# Patient Record
Sex: Female | Born: 1990
Health system: Southern US, Community
[De-identification: ages and names within clinical notes are randomized; demographics above are authoritative.]

## PROBLEM LIST (undated history)

## (undated) DIAGNOSIS — E282 Polycystic ovarian syndrome: Secondary | ICD-10-CM

## (undated) HISTORY — PX: BREAST BIOPSY: SHX20

---
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
## 2013-03-01 DIAGNOSIS — E282 Polycystic ovarian syndrome: Secondary | ICD-10-CM | POA: Insufficient documentation

## 2019-02-15 DIAGNOSIS — F411 Generalized anxiety disorder: Secondary | ICD-10-CM | POA: Diagnosis not present

## 2019-02-25 DIAGNOSIS — F411 Generalized anxiety disorder: Secondary | ICD-10-CM | POA: Diagnosis not present

## 2019-03-04 DIAGNOSIS — F411 Generalized anxiety disorder: Secondary | ICD-10-CM | POA: Diagnosis not present

## 2019-03-15 DIAGNOSIS — F411 Generalized anxiety disorder: Secondary | ICD-10-CM | POA: Diagnosis not present

## 2019-03-23 DIAGNOSIS — F411 Generalized anxiety disorder: Secondary | ICD-10-CM | POA: Diagnosis not present

## 2019-03-30 DIAGNOSIS — F411 Generalized anxiety disorder: Secondary | ICD-10-CM | POA: Diagnosis not present

## 2019-04-06 DIAGNOSIS — F411 Generalized anxiety disorder: Secondary | ICD-10-CM | POA: Diagnosis not present

## 2019-04-16 DIAGNOSIS — F411 Generalized anxiety disorder: Secondary | ICD-10-CM | POA: Diagnosis not present

## 2019-04-21 DIAGNOSIS — F411 Generalized anxiety disorder: Secondary | ICD-10-CM | POA: Diagnosis not present

## 2019-04-28 DIAGNOSIS — F411 Generalized anxiety disorder: Secondary | ICD-10-CM | POA: Diagnosis not present

## 2019-05-06 DIAGNOSIS — F411 Generalized anxiety disorder: Secondary | ICD-10-CM | POA: Diagnosis not present

## 2019-05-11 DIAGNOSIS — F411 Generalized anxiety disorder: Secondary | ICD-10-CM | POA: Diagnosis not present

## 2019-05-18 DIAGNOSIS — F411 Generalized anxiety disorder: Secondary | ICD-10-CM | POA: Diagnosis not present

## 2019-05-25 DIAGNOSIS — F411 Generalized anxiety disorder: Secondary | ICD-10-CM | POA: Diagnosis not present

## 2019-06-01 DIAGNOSIS — F411 Generalized anxiety disorder: Secondary | ICD-10-CM | POA: Diagnosis not present

## 2019-06-08 DIAGNOSIS — F411 Generalized anxiety disorder: Secondary | ICD-10-CM | POA: Diagnosis not present

## 2019-06-15 DIAGNOSIS — F411 Generalized anxiety disorder: Secondary | ICD-10-CM | POA: Diagnosis not present

## 2019-06-22 DIAGNOSIS — F411 Generalized anxiety disorder: Secondary | ICD-10-CM | POA: Diagnosis not present

## 2019-06-29 DIAGNOSIS — F411 Generalized anxiety disorder: Secondary | ICD-10-CM | POA: Diagnosis not present

## 2019-07-06 DIAGNOSIS — F411 Generalized anxiety disorder: Secondary | ICD-10-CM | POA: Diagnosis not present

## 2019-07-13 DIAGNOSIS — F411 Generalized anxiety disorder: Secondary | ICD-10-CM | POA: Diagnosis not present

## 2019-07-20 DIAGNOSIS — F411 Generalized anxiety disorder: Secondary | ICD-10-CM | POA: Diagnosis not present

## 2019-07-27 DIAGNOSIS — F411 Generalized anxiety disorder: Secondary | ICD-10-CM | POA: Diagnosis not present

## 2019-08-03 DIAGNOSIS — F411 Generalized anxiety disorder: Secondary | ICD-10-CM | POA: Diagnosis not present

## 2019-08-17 DIAGNOSIS — F411 Generalized anxiety disorder: Secondary | ICD-10-CM | POA: Diagnosis not present

## 2019-08-24 DIAGNOSIS — F411 Generalized anxiety disorder: Secondary | ICD-10-CM | POA: Diagnosis not present

## 2019-08-31 DIAGNOSIS — F411 Generalized anxiety disorder: Secondary | ICD-10-CM | POA: Diagnosis not present

## 2019-09-07 DIAGNOSIS — F411 Generalized anxiety disorder: Secondary | ICD-10-CM | POA: Diagnosis not present

## 2019-09-13 DIAGNOSIS — G44209 Tension-type headache, unspecified, not intractable: Secondary | ICD-10-CM | POA: Diagnosis not present

## 2019-09-13 DIAGNOSIS — M9902 Segmental and somatic dysfunction of thoracic region: Secondary | ICD-10-CM | POA: Diagnosis not present

## 2019-09-13 DIAGNOSIS — M9901 Segmental and somatic dysfunction of cervical region: Secondary | ICD-10-CM | POA: Diagnosis not present

## 2019-09-13 DIAGNOSIS — M9903 Segmental and somatic dysfunction of lumbar region: Secondary | ICD-10-CM | POA: Diagnosis not present

## 2019-09-14 DIAGNOSIS — F411 Generalized anxiety disorder: Secondary | ICD-10-CM | POA: Diagnosis not present

## 2019-09-14 DIAGNOSIS — G44209 Tension-type headache, unspecified, not intractable: Secondary | ICD-10-CM | POA: Diagnosis not present

## 2019-09-14 DIAGNOSIS — M9903 Segmental and somatic dysfunction of lumbar region: Secondary | ICD-10-CM | POA: Diagnosis not present

## 2019-09-14 DIAGNOSIS — M9901 Segmental and somatic dysfunction of cervical region: Secondary | ICD-10-CM | POA: Diagnosis not present

## 2019-09-14 DIAGNOSIS — M9902 Segmental and somatic dysfunction of thoracic region: Secondary | ICD-10-CM | POA: Diagnosis not present

## 2019-09-17 DIAGNOSIS — M9902 Segmental and somatic dysfunction of thoracic region: Secondary | ICD-10-CM | POA: Diagnosis not present

## 2019-09-17 DIAGNOSIS — M9901 Segmental and somatic dysfunction of cervical region: Secondary | ICD-10-CM | POA: Diagnosis not present

## 2019-09-17 DIAGNOSIS — M9903 Segmental and somatic dysfunction of lumbar region: Secondary | ICD-10-CM | POA: Diagnosis not present

## 2019-09-21 DIAGNOSIS — M9903 Segmental and somatic dysfunction of lumbar region: Secondary | ICD-10-CM | POA: Diagnosis not present

## 2019-09-21 DIAGNOSIS — M9901 Segmental and somatic dysfunction of cervical region: Secondary | ICD-10-CM | POA: Diagnosis not present

## 2019-09-21 DIAGNOSIS — M9902 Segmental and somatic dysfunction of thoracic region: Secondary | ICD-10-CM | POA: Diagnosis not present

## 2019-09-21 DIAGNOSIS — F411 Generalized anxiety disorder: Secondary | ICD-10-CM | POA: Diagnosis not present

## 2019-09-24 DIAGNOSIS — M9902 Segmental and somatic dysfunction of thoracic region: Secondary | ICD-10-CM | POA: Diagnosis not present

## 2019-09-24 DIAGNOSIS — M9901 Segmental and somatic dysfunction of cervical region: Secondary | ICD-10-CM | POA: Diagnosis not present

## 2019-09-24 DIAGNOSIS — M9903 Segmental and somatic dysfunction of lumbar region: Secondary | ICD-10-CM | POA: Diagnosis not present

## 2019-09-28 DIAGNOSIS — F411 Generalized anxiety disorder: Secondary | ICD-10-CM | POA: Diagnosis not present

## 2019-10-04 DIAGNOSIS — M9906 Segmental and somatic dysfunction of lower extremity: Secondary | ICD-10-CM | POA: Diagnosis not present

## 2019-10-04 DIAGNOSIS — M9902 Segmental and somatic dysfunction of thoracic region: Secondary | ICD-10-CM | POA: Diagnosis not present

## 2019-10-04 DIAGNOSIS — M9901 Segmental and somatic dysfunction of cervical region: Secondary | ICD-10-CM | POA: Diagnosis not present

## 2019-10-04 DIAGNOSIS — M9903 Segmental and somatic dysfunction of lumbar region: Secondary | ICD-10-CM | POA: Diagnosis not present

## 2019-10-12 DIAGNOSIS — F411 Generalized anxiety disorder: Secondary | ICD-10-CM | POA: Diagnosis not present

## 2019-10-12 DIAGNOSIS — M9901 Segmental and somatic dysfunction of cervical region: Secondary | ICD-10-CM | POA: Diagnosis not present

## 2019-10-12 DIAGNOSIS — M9903 Segmental and somatic dysfunction of lumbar region: Secondary | ICD-10-CM | POA: Diagnosis not present

## 2019-10-12 DIAGNOSIS — M9906 Segmental and somatic dysfunction of lower extremity: Secondary | ICD-10-CM | POA: Diagnosis not present

## 2019-10-12 DIAGNOSIS — M9902 Segmental and somatic dysfunction of thoracic region: Secondary | ICD-10-CM | POA: Diagnosis not present

## 2019-10-19 DIAGNOSIS — M9902 Segmental and somatic dysfunction of thoracic region: Secondary | ICD-10-CM | POA: Diagnosis not present

## 2019-10-19 DIAGNOSIS — F411 Generalized anxiety disorder: Secondary | ICD-10-CM | POA: Diagnosis not present

## 2019-10-19 DIAGNOSIS — M9903 Segmental and somatic dysfunction of lumbar region: Secondary | ICD-10-CM | POA: Diagnosis not present

## 2019-10-19 DIAGNOSIS — M9906 Segmental and somatic dysfunction of lower extremity: Secondary | ICD-10-CM | POA: Diagnosis not present

## 2019-10-19 DIAGNOSIS — M9901 Segmental and somatic dysfunction of cervical region: Secondary | ICD-10-CM | POA: Diagnosis not present

## 2019-11-02 DIAGNOSIS — F411 Generalized anxiety disorder: Secondary | ICD-10-CM | POA: Diagnosis not present

## 2019-11-04 DIAGNOSIS — M9903 Segmental and somatic dysfunction of lumbar region: Secondary | ICD-10-CM | POA: Diagnosis not present

## 2019-11-04 DIAGNOSIS — M9902 Segmental and somatic dysfunction of thoracic region: Secondary | ICD-10-CM | POA: Diagnosis not present

## 2019-11-04 DIAGNOSIS — M9901 Segmental and somatic dysfunction of cervical region: Secondary | ICD-10-CM | POA: Diagnosis not present

## 2019-11-09 DIAGNOSIS — F411 Generalized anxiety disorder: Secondary | ICD-10-CM | POA: Diagnosis not present

## 2019-11-16 DIAGNOSIS — F411 Generalized anxiety disorder: Secondary | ICD-10-CM | POA: Diagnosis not present

## 2019-11-18 DIAGNOSIS — M9901 Segmental and somatic dysfunction of cervical region: Secondary | ICD-10-CM | POA: Diagnosis not present

## 2019-11-18 DIAGNOSIS — M9903 Segmental and somatic dysfunction of lumbar region: Secondary | ICD-10-CM | POA: Diagnosis not present

## 2019-11-18 DIAGNOSIS — M9902 Segmental and somatic dysfunction of thoracic region: Secondary | ICD-10-CM | POA: Diagnosis not present

## 2019-11-30 DIAGNOSIS — M9901 Segmental and somatic dysfunction of cervical region: Secondary | ICD-10-CM | POA: Diagnosis not present

## 2019-11-30 DIAGNOSIS — M9902 Segmental and somatic dysfunction of thoracic region: Secondary | ICD-10-CM | POA: Diagnosis not present

## 2019-11-30 DIAGNOSIS — F411 Generalized anxiety disorder: Secondary | ICD-10-CM | POA: Diagnosis not present

## 2019-11-30 DIAGNOSIS — M9903 Segmental and somatic dysfunction of lumbar region: Secondary | ICD-10-CM | POA: Diagnosis not present

## 2019-12-07 DIAGNOSIS — F411 Generalized anxiety disorder: Secondary | ICD-10-CM | POA: Diagnosis not present

## 2019-12-13 DIAGNOSIS — M9902 Segmental and somatic dysfunction of thoracic region: Secondary | ICD-10-CM | POA: Diagnosis not present

## 2019-12-13 DIAGNOSIS — M9901 Segmental and somatic dysfunction of cervical region: Secondary | ICD-10-CM | POA: Diagnosis not present

## 2019-12-13 DIAGNOSIS — M9903 Segmental and somatic dysfunction of lumbar region: Secondary | ICD-10-CM | POA: Diagnosis not present

## 2019-12-14 DIAGNOSIS — F411 Generalized anxiety disorder: Secondary | ICD-10-CM | POA: Diagnosis not present

## 2019-12-21 DIAGNOSIS — F411 Generalized anxiety disorder: Secondary | ICD-10-CM | POA: Diagnosis not present

## 2019-12-24 DIAGNOSIS — M9906 Segmental and somatic dysfunction of lower extremity: Secondary | ICD-10-CM | POA: Diagnosis not present

## 2019-12-24 DIAGNOSIS — M9901 Segmental and somatic dysfunction of cervical region: Secondary | ICD-10-CM | POA: Diagnosis not present

## 2019-12-24 DIAGNOSIS — M9903 Segmental and somatic dysfunction of lumbar region: Secondary | ICD-10-CM | POA: Diagnosis not present

## 2019-12-24 DIAGNOSIS — M9902 Segmental and somatic dysfunction of thoracic region: Secondary | ICD-10-CM | POA: Diagnosis not present

## 2020-01-04 DIAGNOSIS — F411 Generalized anxiety disorder: Secondary | ICD-10-CM | POA: Diagnosis not present

## 2020-01-07 DIAGNOSIS — M9902 Segmental and somatic dysfunction of thoracic region: Secondary | ICD-10-CM | POA: Diagnosis not present

## 2020-01-07 DIAGNOSIS — M9901 Segmental and somatic dysfunction of cervical region: Secondary | ICD-10-CM | POA: Diagnosis not present

## 2020-01-07 DIAGNOSIS — M9903 Segmental and somatic dysfunction of lumbar region: Secondary | ICD-10-CM | POA: Diagnosis not present

## 2020-01-07 DIAGNOSIS — M9906 Segmental and somatic dysfunction of lower extremity: Secondary | ICD-10-CM | POA: Diagnosis not present

## 2020-01-14 DIAGNOSIS — M9906 Segmental and somatic dysfunction of lower extremity: Secondary | ICD-10-CM | POA: Diagnosis not present

## 2020-01-14 DIAGNOSIS — M9903 Segmental and somatic dysfunction of lumbar region: Secondary | ICD-10-CM | POA: Diagnosis not present

## 2020-01-14 DIAGNOSIS — M9902 Segmental and somatic dysfunction of thoracic region: Secondary | ICD-10-CM | POA: Diagnosis not present

## 2020-01-14 DIAGNOSIS — M9901 Segmental and somatic dysfunction of cervical region: Secondary | ICD-10-CM | POA: Diagnosis not present

## 2020-01-21 DIAGNOSIS — M9903 Segmental and somatic dysfunction of lumbar region: Secondary | ICD-10-CM | POA: Diagnosis not present

## 2020-01-21 DIAGNOSIS — M9902 Segmental and somatic dysfunction of thoracic region: Secondary | ICD-10-CM | POA: Diagnosis not present

## 2020-01-21 DIAGNOSIS — M9901 Segmental and somatic dysfunction of cervical region: Secondary | ICD-10-CM | POA: Diagnosis not present

## 2020-01-25 DIAGNOSIS — F411 Generalized anxiety disorder: Secondary | ICD-10-CM | POA: Diagnosis not present

## 2020-02-01 DIAGNOSIS — F411 Generalized anxiety disorder: Secondary | ICD-10-CM | POA: Diagnosis not present

## 2020-02-04 DIAGNOSIS — M9902 Segmental and somatic dysfunction of thoracic region: Secondary | ICD-10-CM | POA: Diagnosis not present

## 2020-02-04 DIAGNOSIS — M9906 Segmental and somatic dysfunction of lower extremity: Secondary | ICD-10-CM | POA: Diagnosis not present

## 2020-02-04 DIAGNOSIS — M9901 Segmental and somatic dysfunction of cervical region: Secondary | ICD-10-CM | POA: Diagnosis not present

## 2020-02-04 DIAGNOSIS — M9903 Segmental and somatic dysfunction of lumbar region: Secondary | ICD-10-CM | POA: Diagnosis not present

## 2020-02-08 DIAGNOSIS — F411 Generalized anxiety disorder: Secondary | ICD-10-CM | POA: Diagnosis not present

## 2020-02-15 DIAGNOSIS — F411 Generalized anxiety disorder: Secondary | ICD-10-CM | POA: Diagnosis not present

## 2020-02-18 DIAGNOSIS — M9902 Segmental and somatic dysfunction of thoracic region: Secondary | ICD-10-CM | POA: Diagnosis not present

## 2020-02-18 DIAGNOSIS — M9903 Segmental and somatic dysfunction of lumbar region: Secondary | ICD-10-CM | POA: Diagnosis not present

## 2020-02-18 DIAGNOSIS — M9906 Segmental and somatic dysfunction of lower extremity: Secondary | ICD-10-CM | POA: Diagnosis not present

## 2020-02-18 DIAGNOSIS — M9901 Segmental and somatic dysfunction of cervical region: Secondary | ICD-10-CM | POA: Diagnosis not present

## 2020-02-22 DIAGNOSIS — F411 Generalized anxiety disorder: Secondary | ICD-10-CM | POA: Diagnosis not present

## 2020-02-29 DIAGNOSIS — F411 Generalized anxiety disorder: Secondary | ICD-10-CM | POA: Diagnosis not present

## 2020-03-06 DIAGNOSIS — M9902 Segmental and somatic dysfunction of thoracic region: Secondary | ICD-10-CM | POA: Diagnosis not present

## 2020-03-06 DIAGNOSIS — M9903 Segmental and somatic dysfunction of lumbar region: Secondary | ICD-10-CM | POA: Diagnosis not present

## 2020-03-06 DIAGNOSIS — M9901 Segmental and somatic dysfunction of cervical region: Secondary | ICD-10-CM | POA: Diagnosis not present

## 2020-03-07 DIAGNOSIS — F411 Generalized anxiety disorder: Secondary | ICD-10-CM | POA: Diagnosis not present

## 2020-03-14 DIAGNOSIS — M9902 Segmental and somatic dysfunction of thoracic region: Secondary | ICD-10-CM | POA: Diagnosis not present

## 2020-03-14 DIAGNOSIS — M9903 Segmental and somatic dysfunction of lumbar region: Secondary | ICD-10-CM | POA: Diagnosis not present

## 2020-03-14 DIAGNOSIS — M9901 Segmental and somatic dysfunction of cervical region: Secondary | ICD-10-CM | POA: Diagnosis not present

## 2020-03-15 DIAGNOSIS — Z20822 Contact with and (suspected) exposure to covid-19: Secondary | ICD-10-CM | POA: Diagnosis not present

## 2020-03-27 DIAGNOSIS — M9901 Segmental and somatic dysfunction of cervical region: Secondary | ICD-10-CM | POA: Diagnosis not present

## 2020-03-27 DIAGNOSIS — M9902 Segmental and somatic dysfunction of thoracic region: Secondary | ICD-10-CM | POA: Diagnosis not present

## 2020-03-27 DIAGNOSIS — M9903 Segmental and somatic dysfunction of lumbar region: Secondary | ICD-10-CM | POA: Diagnosis not present

## 2020-03-28 DIAGNOSIS — F411 Generalized anxiety disorder: Secondary | ICD-10-CM | POA: Diagnosis not present

## 2020-04-04 DIAGNOSIS — F411 Generalized anxiety disorder: Secondary | ICD-10-CM | POA: Diagnosis not present

## 2020-04-11 DIAGNOSIS — M9903 Segmental and somatic dysfunction of lumbar region: Secondary | ICD-10-CM | POA: Diagnosis not present

## 2020-04-11 DIAGNOSIS — M9901 Segmental and somatic dysfunction of cervical region: Secondary | ICD-10-CM | POA: Diagnosis not present

## 2020-04-11 DIAGNOSIS — M9902 Segmental and somatic dysfunction of thoracic region: Secondary | ICD-10-CM | POA: Diagnosis not present

## 2020-04-11 DIAGNOSIS — F411 Generalized anxiety disorder: Secondary | ICD-10-CM | POA: Diagnosis not present

## 2020-04-17 ENCOUNTER — Other Ambulatory Visit: Payer: Self-pay

## 2020-04-17 ENCOUNTER — Ambulatory Visit
Admission: EM | Admit: 2020-04-17 | Discharge: 2020-04-17 | Disposition: A | Discharge status: Home / Self Care | Payer: BC Managed Care – PPO | Attending: Family Medicine | Admitting: Family Medicine

## 2020-04-17 DIAGNOSIS — J019 Acute sinusitis, unspecified: Secondary | ICD-10-CM | POA: Diagnosis not present

## 2020-04-17 HISTORY — DX: Polycystic ovarian syndrome: E28.2

## 2020-04-17 MED ORDER — AMOXICILLIN-POT CLAVULANATE 875-125 MG PO TABS: 1.0000 | ORAL_TABLET | Freq: Two times a day (BID) | ORAL | 0 refills | Status: DC

## 2020-04-17 NOTE — ED Triage Notes (Signed)
Patient complains of sinus pain and pressure, headache, fatigue that started on Thursday. Patient states that she has been taking her allergy medication daily. Reports that she was outside this weekend and they had cut hay and had a bonfire and thinks this [place] have exacerbated her symptoms.

## 2020-04-17 NOTE — ED Provider Notes (Signed)
[facility]    CSN: [id number] Arrival date & time: 04/17/20  0945      History   Chief Complaint Chief Complaint  Patient presents with  . Sinusitis   HPI  29 year old female presents with the above complaint.  Patient reports that she is been sick since Thursday.  Reports congestion, sinus pain and pressure, fatigue.  No documented fever.  Also reports some sore throat as well as chest tightness.  She has been taking over-the-counter medication without relief.  She thinks that this [place] have been exacerbated by being exposed to hay.  No other associated symptoms.  No other complaints.  Past Medical History:  Diagnosis Date  . PCOS (polycystic ovarian syndrome)    Past Surgical History:  Procedure Laterality Date  . BREAST BIOPSY    . KNEE SURGERY Right 2010   OB History   No obstetric history on file.    Home Medications    Prior to Admission medications   Medication Sig Start Date End Date Taking? Authorizing Provider  loratadine (CLARITIN) 10 MG tablet Take 10 mg by mouth daily.   Yes [provider]  amoxicillin-clavulanate (AUGMENTIN) 875-125 MG tablet Take 1 tablet by mouth 2 (two) times daily. 04/17/20   Coral Spikes, DO   Family History Family History  Problem Relation Age of Onset  . Breast cancer Mother   . Melanoma Father    Social History Social History   Tobacco Use  . Smoking status: Former Research scientist (life sciences)  . Smokeless tobacco: Never Used  Substance Use Topics  . Alcohol use: Yes    Comment: occasionally  . Drug use: Never   Allergies   Patient has no known allergies.   Review of Systems Review of Systems  Constitutional: Positive for fatigue. Negative for fever.  HENT: Positive for congestion, sinus pressure and sinus pain.   Respiratory: Positive for chest tightness.   Neurological: Positive for headaches.   Physical Exam Triage Vital Signs ED Triage Vitals  Enc Vitals Group     BP 04/17/20 1015 (!) 131/92     Pulse  Rate 04/17/20 1015 (!) 112     Resp 04/17/20 1015 18     Temp 04/17/20 1015 98.6 F (37 C)     Temp Source 04/17/20 1015 Oral     SpO2 04/17/20 1015 99 %     Weight 04/17/20 1012 (!) 340 lb (154.2 kg)     Height 04/17/20 1012 5\' 7"  (1.702 m)     Head Circumference --      Peak Flow --      Pain Score 04/17/20 1012 5     Pain Loc --      Pain Edu? --      Excl. in [place]? --    Updated Vital Signs BP (!) 131/92 (BP Location: Left Arm)   Pulse (!) 112   Temp 98.6 F (37 C) (Oral)   Resp 18   Ht 5\' 7"  (1.702 m)   Wt (!) 154.2 kg   SpO2 99%   BMI 53.25 kg/m   Visual Acuity Right Eye Distance:   Left Eye Distance:   Bilateral Distance:    Right Eye Near:   Left Eye Near:    Bilateral Near:     Physical Exam Constitutional:      General: She is not in acute distress.    Appearance: Normal appearance. She is obese. She is not ill-appearing.  HENT:     Head: Normocephalic and  atraumatic.     Right Ear: Tympanic membrane normal.     Left Ear: Tympanic membrane normal.     Nose: Congestion present.     Mouth/Throat:     Pharynx: Posterior oropharyngeal erythema present. No oropharyngeal exudate.  Eyes:     General:        Right eye: No discharge.        Left eye: No discharge.     Conjunctiva/sclera: Conjunctivae normal.  Cardiovascular:     Rate and Rhythm: Regular rhythm. Tachycardia present.     Heart sounds: No murmur.  Pulmonary:     Effort: Pulmonary effort is normal.     Breath sounds: Normal breath sounds. No wheezing or rales.  Neurological:     Mental Status: She is alert.  Psychiatric:        Mood and Affect: Mood normal.        Behavior: Behavior normal.    UC Treatments / Results  Labs (all labs ordered are listed, but only abnormal results are displayed) Labs Reviewed - No data to display  EKG   Radiology No results found.  Procedures Procedures (including critical care time)  Medications Ordered in UC Medications - No data to display   Initial Impression / Assessment and Plan / UC Course  I have reviewed the triage vital signs and the nursing notes.  Pertinent labs & imaging results that were available during my care of the patient were reviewed by me and considered in my medical decision making (see chart for details).    29 year old female presents with sinusitis.  Treating with Augmentin.  Final Clinical Impressions(s) / UC Diagnoses   Final diagnoses:  Acute sinusitis, recurrence not specified, unspecified location   Discharge Instructions   None    ED Prescriptions    Medication Sig Dispense Auth. Provider   amoxicillin-clavulanate (AUGMENTIN) 875-125 MG tablet Take 1 tablet by mouth 2 (two) times daily. 20 tablet Tommie Sams, DO     PDMP not reviewed this encounter.   Tommie Sams, [place] 04/17/20 1119

## 2020-05-02 DIAGNOSIS — F411 Generalized anxiety disorder: Secondary | ICD-10-CM | POA: Diagnosis not present

## 2020-05-09 DIAGNOSIS — M9903 Segmental and somatic dysfunction of lumbar region: Secondary | ICD-10-CM | POA: Diagnosis not present

## 2020-05-09 DIAGNOSIS — F411 Generalized anxiety disorder: Secondary | ICD-10-CM | POA: Diagnosis not present

## 2020-05-09 DIAGNOSIS — M9902 Segmental and somatic dysfunction of thoracic region: Secondary | ICD-10-CM | POA: Diagnosis not present

## 2020-05-09 DIAGNOSIS — M9906 Segmental and somatic dysfunction of lower extremity: Secondary | ICD-10-CM | POA: Diagnosis not present

## 2020-05-09 DIAGNOSIS — M9901 Segmental and somatic dysfunction of cervical region: Secondary | ICD-10-CM | POA: Diagnosis not present

## 2020-05-23 DIAGNOSIS — F411 Generalized anxiety disorder: Secondary | ICD-10-CM | POA: Diagnosis not present

## 2020-05-30 DIAGNOSIS — F411 Generalized anxiety disorder: Secondary | ICD-10-CM | POA: Diagnosis not present

## 2020-06-13 DIAGNOSIS — F411 Generalized anxiety disorder: Secondary | ICD-10-CM | POA: Diagnosis not present

## 2020-06-20 DIAGNOSIS — F411 Generalized anxiety disorder: Secondary | ICD-10-CM | POA: Diagnosis not present

## 2020-06-27 DIAGNOSIS — F411 Generalized anxiety disorder: Secondary | ICD-10-CM | POA: Diagnosis not present

## 2020-07-04 DIAGNOSIS — F411 Generalized anxiety disorder: Secondary | ICD-10-CM | POA: Diagnosis not present

## 2020-07-11 DIAGNOSIS — F411 Generalized anxiety disorder: Secondary | ICD-10-CM | POA: Diagnosis not present

## 2020-07-18 DIAGNOSIS — F411 Generalized anxiety disorder: Secondary | ICD-10-CM | POA: Diagnosis not present

## 2020-07-25 DIAGNOSIS — F411 Generalized anxiety disorder: Secondary | ICD-10-CM | POA: Diagnosis not present

## 2020-08-01 DIAGNOSIS — F411 Generalized anxiety disorder: Secondary | ICD-10-CM | POA: Diagnosis not present

## 2020-08-04 DIAGNOSIS — M9902 Segmental and somatic dysfunction of thoracic region: Secondary | ICD-10-CM | POA: Diagnosis not present

## 2020-08-04 DIAGNOSIS — M9903 Segmental and somatic dysfunction of lumbar region: Secondary | ICD-10-CM | POA: Diagnosis not present

## 2020-08-04 DIAGNOSIS — M9901 Segmental and somatic dysfunction of cervical region: Secondary | ICD-10-CM | POA: Diagnosis not present

## 2020-08-04 DIAGNOSIS — F411 Generalized anxiety disorder: Secondary | ICD-10-CM | POA: Diagnosis not present

## 2020-08-22 ENCOUNTER — Ambulatory Visit
Admission: EM | Admit: 2020-08-22 | Discharge: 2020-08-22 | Disposition: A | Discharge status: Home / Self Care | Payer: BC Managed Care – PPO | Attending: Emergency Medicine | Admitting: Emergency Medicine

## 2020-08-22 DIAGNOSIS — R0981 Nasal congestion: Secondary | ICD-10-CM | POA: Diagnosis not present

## 2020-08-22 DIAGNOSIS — J0100 Acute maxillary sinusitis, unspecified: Secondary | ICD-10-CM | POA: Diagnosis not present

## 2020-08-22 DIAGNOSIS — H6692 Otitis media, unspecified, left ear: Secondary | ICD-10-CM

## 2020-08-22 DIAGNOSIS — Z20822 Contact with and (suspected) exposure to covid-19: Secondary | ICD-10-CM | POA: Diagnosis not present

## 2020-08-22 LAB — POC SARS CORONAVIRUS 2 AG -  ED: SARS Coronavirus 2 Ag: NEGATIVE

## 2020-08-22 MED ORDER — AZITHROMYCIN 250 MG PO TABS
250.0000 mg | ORAL_TABLET | Freq: Every day | ORAL | 0 refills | Status: DC
Start: 2020-08-22 — End: 2020-09-01

## 2020-08-22 NOTE — ED Provider Notes (Signed)
[facility]    CSN: [id number] Arrival date & time: 08/22/20  1520      History   Chief Complaint Chief Complaint  Patient presents with   Ear Fullness   Cough   Nasal Congestion    HPI Christine Valencia is a 29 y.o. female.   Patient presents with 5-day history of ear pain, cough productive of yellow sputum, nasal congestion, postnasal drip, nasal drainage.  Patient reports ongoing issues with seasonal allergies.  She denies fever, chills, shortness of breath, vomiting, diarrhea, or other symptoms.  Treatment attempted at home with OTC allergy medication.  The history is provided by the patient.    Past Medical History:  Diagnosis Date   PCOS (polycystic ovarian syndrome)     There are no problems to display for this patient.   Past Surgical History:  Procedure Laterality Date   BREAST BIOPSY     BREAST LUMPECTOMY  01/11/2011   KNEE SURGERY Right 2010    OB History   No obstetric history on file.      Home Medications    Prior to Admission medications   Medication Sig Start Date End Date Taking? Authorizing Provider  amoxicillin-clavulanate (AUGMENTIN) 875-125 MG tablet Take 1 tablet by mouth 2 (two) times daily. 04/17/20   Tommie Sams, DO  azithromycin (ZITHROMAX) 250 MG tablet Take 1 tablet (250 mg total) by mouth daily. Take first 2 tablets together, then 1 every day until finished. 08/22/20   Mickie Bail, NP  loratadine (CLARITIN) 10 MG tablet Take 10 mg by mouth daily.    [provider]    Family History Family History  Problem Relation Age of Onset   Breast cancer Mother    Melanoma Father     Social History Social History   Tobacco Use   Smoking status: Former Smoker   Smokeless tobacco: Never Used  Building services engineer Use: Never used  Substance Use Topics   Alcohol use: Yes    Comment: occasionally   Drug use: Never     Allergies   Blueberry [vaccinium angustifolium] and Selective estrogen receptor  modulators   Review of Systems Review of Systems  Constitutional: Negative for chills and fever.  HENT: Positive for congestion, ear pain, postnasal drip, rhinorrhea and sinus pressure. Negative for sore throat.   Eyes: Negative for pain and visual disturbance.  Respiratory: Positive for cough. Negative for shortness of breath.   Cardiovascular: Negative for chest pain and palpitations.  Gastrointestinal: Negative for abdominal pain, diarrhea and vomiting.  Genitourinary: Negative for dysuria and hematuria.  Musculoskeletal: Negative for arthralgias and back pain.  Skin: Negative for color change and rash.  Neurological: Negative for seizures and syncope.  All other systems reviewed and are negative.    Physical Exam Triage Vital Signs ED Triage Vitals  Enc Vitals Group     BP      Pulse      Resp      Temp      Temp src      SpO2      Weight      Height      Head Circumference      Peak Flow      Pain Score      Pain Loc      Pain Edu?      Excl. in [place]?    No data found.  Updated Vital Signs BP 106/75    Pulse (!) 105  Temp 99.2 F (37.3 C)    Resp 16    LMP 08/20/2020 (Exact Date)    SpO2 94%   Visual Acuity Right Eye Distance:   Left Eye Distance:   Bilateral Distance:    Right Eye Near:   Left Eye Near:    Bilateral Near:     Physical Exam Vitals and nursing note reviewed.  Constitutional:      General: She is not in acute distress.    Appearance: She is well-developed. She is obese.  HENT:     Head: Normocephalic and atraumatic.     Right Ear: Tympanic membrane normal.     Left Ear: Tympanic membrane is erythematous.     Nose: Congestion present.     Mouth/Throat:     Mouth: Mucous membranes are moist.     Pharynx: Oropharynx is clear.  Eyes:     Conjunctiva/sclera: Conjunctivae normal.  Cardiovascular:     Rate and Rhythm: Normal rate and regular rhythm.     Heart sounds: No murmur heard.   Pulmonary:     Effort: Pulmonary effort is  normal. No respiratory distress.     Breath sounds: Normal breath sounds. No wheezing or rhonchi.  Abdominal:     Palpations: Abdomen is soft.     Tenderness: There is no abdominal tenderness. There is no guarding or rebound.  Musculoskeletal:     Cervical back: Neck supple.  Skin:    General: Skin is warm and dry.     Findings: No rash.  Neurological:     General: No focal deficit present.     Mental Status: She is alert and oriented to person, place, and time.     Gait: Gait normal.  Psychiatric:        Mood and Affect: Mood normal.        Behavior: Behavior normal.      UC Treatments / Results  Labs (all labs ordered are listed, but only abnormal results are displayed) Labs Reviewed  NOVEL CORONAVIRUS, NAA  POC SARS CORONAVIRUS 2 AG -  ED    EKG   Radiology No results found.  Procedures Procedures (including critical care time)  Medications Ordered in UC Medications - No data to display  Initial Impression / Assessment and Plan / UC Course  I have reviewed the triage vital signs and the nursing notes.  Pertinent labs & imaging results that were available during my care of the patient were reviewed by me and considered in my medical decision making (see chart for details).   Left otitis media, acute sinusitis.  POC COVID negative; PCR pending.  Treating with Zithromax, Mucinex, Tylenol or ibuprofen.  Instructed patient to follow-up with her PCP if her symptoms or not improving.  Instructed her to self quarantine until the Covid test result is back.  Instructed her to go to the ED if she has acute shortness of breath or other concerning symptoms.  Patient agrees to plan of care.   Final Clinical Impressions(s) / UC Diagnoses   Final diagnoses:  Left otitis media, unspecified otitis media type  Acute non-recurrent maxillary sinusitis     Discharge Instructions     Take the antibiotic as directed.  Tylenol or ibuprofen as needed for discomfort.  Mucinex as  needed for congestion.    Follow up with your primary care provider if your symptoms are not improving.    Your rapid COVID test is negative; the send-out test is pending.  You should self  quarantine until your test result is back and is negative.    Go to the emergency department if you have acute worsening symptoms.        ED Prescriptions    Medication Sig Dispense Auth. Provider   azithromycin (ZITHROMAX) 250 MG tablet Take 1 tablet (250 mg total) by mouth daily. Take first 2 tablets together, then 1 every day until finished. 6 tablet Mickie Bail, NP     PDMP not reviewed this encounter.   Mickie Bail, NP 08/22/20 1606

## 2020-08-22 NOTE — Discharge Instructions (Addendum)
Take the antibiotic as directed.  Tylenol or ibuprofen as needed for discomfort.  Mucinex as needed for congestion.    Follow up with your primary care provider if your symptoms are not improving.    Your rapid COVID test is negative; the send-out test is pending.  You should self quarantine until your test result is back and is negative.    Go to the emergency department if you have acute worsening symptoms.

## 2020-08-22 NOTE — ED Triage Notes (Signed)
Patient c/o bilateral ear fullness, productive cough, nasal congestion, and nasal drainage. Patient reports she has seasonal allergies, and this feels similar to that. Declines covid testing.

## 2020-08-24 LAB — SARS-COV-2, NAA 2 DAY TAT

## 2020-08-24 LAB — NOVEL CORONAVIRUS, NAA: SARS-CoV-2, NAA: NOT DETECTED

## 2020-08-26 ENCOUNTER — Other Ambulatory Visit: Payer: Self-pay

## 2020-08-26 ENCOUNTER — Encounter: Payer: Self-pay | Admitting: Emergency Medicine

## 2020-08-26 ENCOUNTER — Emergency Department: Payer: BC Managed Care – PPO

## 2020-08-26 DIAGNOSIS — J302 Other seasonal allergic rhinitis: Secondary | ICD-10-CM | POA: Diagnosis present

## 2020-08-26 DIAGNOSIS — Z9102 Food additives allergy status: Secondary | ICD-10-CM

## 2020-08-26 DIAGNOSIS — Z6841 Body Mass Index (BMI) 40.0 and over, adult: Secondary | ICD-10-CM

## 2020-08-26 DIAGNOSIS — J209 Acute bronchitis, unspecified: Secondary | ICD-10-CM | POA: Diagnosis not present

## 2020-08-26 DIAGNOSIS — Z825 Family history of asthma and other chronic lower respiratory diseases: Secondary | ICD-10-CM | POA: Diagnosis not present

## 2020-08-26 DIAGNOSIS — E282 Polycystic ovarian syndrome: Secondary | ICD-10-CM | POA: Diagnosis not present

## 2020-08-26 DIAGNOSIS — R000 Tachycardia, unspecified: Secondary | ICD-10-CM | POA: Diagnosis not present

## 2020-08-26 DIAGNOSIS — Z20822 Contact with and (suspected) exposure to covid-19: Secondary | ICD-10-CM | POA: Diagnosis not present

## 2020-08-26 DIAGNOSIS — A4189 Other specified sepsis: Secondary | ICD-10-CM | POA: Diagnosis not present

## 2020-08-26 DIAGNOSIS — I340 Nonrheumatic mitral (valve) insufficiency: Secondary | ICD-10-CM | POA: Diagnosis not present

## 2020-08-26 DIAGNOSIS — T380X5A Adverse effect of glucocorticoids and synthetic analogues, initial encounter: Secondary | ICD-10-CM | POA: Diagnosis not present

## 2020-08-26 DIAGNOSIS — Z87891 Personal history of nicotine dependence: Secondary | ICD-10-CM | POA: Diagnosis not present

## 2020-08-26 DIAGNOSIS — R0982 Postnasal drip: Secondary | ICD-10-CM | POA: Diagnosis not present

## 2020-08-26 DIAGNOSIS — J205 Acute bronchitis due to respiratory syncytial virus: Secondary | ICD-10-CM | POA: Diagnosis not present

## 2020-08-26 DIAGNOSIS — R9431 Abnormal electrocardiogram [ECG] [EKG]: Secondary | ICD-10-CM | POA: Diagnosis not present

## 2020-08-26 DIAGNOSIS — J210 Acute bronchiolitis due to respiratory syncytial virus: Secondary | ICD-10-CM | POA: Diagnosis not present

## 2020-08-26 DIAGNOSIS — R6521 Severe sepsis with septic shock: Secondary | ICD-10-CM | POA: Diagnosis not present

## 2020-08-26 DIAGNOSIS — F419 Anxiety disorder, unspecified: Secondary | ICD-10-CM | POA: Diagnosis not present

## 2020-08-26 DIAGNOSIS — J9801 Acute bronchospasm: Secondary | ICD-10-CM | POA: Diagnosis not present

## 2020-08-26 DIAGNOSIS — H9202 Otalgia, left ear: Secondary | ICD-10-CM | POA: Diagnosis not present

## 2020-08-26 DIAGNOSIS — Y929 Unspecified place or not applicable: Secondary | ICD-10-CM

## 2020-08-26 DIAGNOSIS — R0602 Shortness of breath: Secondary | ICD-10-CM | POA: Diagnosis not present

## 2020-08-26 DIAGNOSIS — J219 Acute bronchiolitis, unspecified: Secondary | ICD-10-CM | POA: Diagnosis not present

## 2020-08-26 DIAGNOSIS — R05 Cough: Secondary | ICD-10-CM | POA: Diagnosis not present

## 2020-08-26 LAB — BASIC METABOLIC PANEL
Anion gap: 10 (ref 5–15)
BUN: 17 mg/dL (ref 6–20)
CO2: 26 mmol/L (ref 22–32)
Calcium: 9 mg/dL (ref 8.9–10.3)
Chloride: 102 mmol/L (ref 98–111)
Creatinine, Ser: 1.06 mg/dL — ABNORMAL HIGH (ref 0.44–1.00)
GFR calc Af Amer: >60 mL/min (ref >60)
GFR calc non Af Amer: >60 mL/min (ref >60)
Glucose, Bld: 90 mg/dL (ref 70–99)
Potassium: 3.7 mmol/L (ref 3.5–5.1)
Sodium: 138 mmol/L (ref 135–145)

## 2020-08-26 LAB — CBC
HCT: 42.9 % (ref 36.0–46.0)
Hemoglobin: 14.6 g/dL (ref 12.0–15.0)
MCH: 29.4 pg (ref 26.0–34.0)
MCHC: 34 g/dL (ref 30.0–36.0)
MCV: 86.3 fL (ref 80.0–100.0)
Platelets: 304 10*3/uL (ref 150–400)
RBC: 4.97 MIL/uL (ref 3.87–5.11)
RDW: 13.6 % (ref 11.5–15.5)
WBC: 11.2 10*3/uL — ABNORMAL HIGH (ref 4.0–10.5)
nRBC: 0 % (ref 0.0–0.2)

## 2020-08-26 LAB — TROPONIN I (HIGH SENSITIVITY): Troponin I (High Sensitivity): 3 ng/L (ref <18)

## 2020-08-26 NOTE — ED Triage Notes (Signed)
Patient states that she was seen at Urgent Care on Tuesday and diagnosed with a sinus infection. Patient states that she completed her antibiotics today but she is now feeling short of breath. Patient states that she was negative for Covid.

## 2020-08-27 ENCOUNTER — Other Ambulatory Visit: Payer: Self-pay

## 2020-08-27 ENCOUNTER — Inpatient Hospital Stay
Admission: EM | Admit: 2020-08-27 | Discharge: 2020-09-01 | DRG: 871 | Disposition: A | Discharge status: Home / Self Care | Payer: BC Managed Care – PPO | Attending: Internal Medicine | Admitting: Internal Medicine

## 2020-08-27 DIAGNOSIS — J205 Acute bronchitis due to respiratory syncytial virus: Secondary | ICD-10-CM | POA: Diagnosis not present

## 2020-08-27 DIAGNOSIS — J219 Acute bronchiolitis, unspecified: Secondary | ICD-10-CM

## 2020-08-27 DIAGNOSIS — J209 Acute bronchitis, unspecified: Secondary | ICD-10-CM | POA: Diagnosis not present

## 2020-08-27 DIAGNOSIS — J9801 Acute bronchospasm: Secondary | ICD-10-CM

## 2020-08-27 DIAGNOSIS — J210 Acute bronchiolitis due to respiratory syncytial virus: Secondary | ICD-10-CM | POA: Diagnosis present

## 2020-08-27 DIAGNOSIS — A419 Sepsis, unspecified organism: Secondary | ICD-10-CM | POA: Diagnosis present

## 2020-08-27 LAB — LACTIC ACID, PLASMA
Lactic Acid, Venous: 6.4 mmol/L (ref 0.5–1.9)
Lactic Acid, Venous: 8.9 mmol/L (ref 0.5–1.9)

## 2020-08-27 LAB — PREGNANCY, URINE: Preg Test, Ur: NEGATIVE

## 2020-08-27 LAB — BRAIN NATRIURETIC PEPTIDE: B Natriuretic Peptide: 24.5 pg/mL (ref 0.0–100.0)

## 2020-08-27 LAB — RESP PANEL BY RT PCR (RSV, FLU A&B, COVID)
Influenza A by PCR: NEGATIVE
Influenza B by PCR: NEGATIVE
Respiratory Syncytial Virus by PCR: POSITIVE — AB
SARS Coronavirus 2 by RT PCR: NEGATIVE

## 2020-08-27 LAB — FIBRIN DERIVATIVES D-DIMER (ARMC ONLY): Fibrin derivatives D-dimer (ARMC): 345.44 ng/mL (FEU) (ref 0.00–499.00)

## 2020-08-27 LAB — PROCALCITONIN: Procalcitonin: <0.1 ng/mL

## 2020-08-27 LAB — TROPONIN I (HIGH SENSITIVITY): Troponin I (High Sensitivity): 3 ng/L (ref <18)

## 2020-08-27 MED ORDER — LORATADINE 10 MG PO TABS
10.0000 mg | ORAL_TABLET | Freq: Every day | ORAL | Status: DC
  Administered 2020-08-27 – 2020-09-01 (×6): 10 mg via ORAL
  Filled 2020-08-27 (×6): qty 1

## 2020-08-27 MED ORDER — MAGNESIUM SULFATE 2 GM/50ML IV SOLN
2.0000 g | Freq: Once | INTRAVENOUS | Status: AC
  Administered 2020-08-27: 2 g via INTRAVENOUS
  Filled 2020-08-27: qty 50

## 2020-08-27 MED ORDER — ACETAMINOPHEN 325 MG PO TABS
650.0000 mg | ORAL_TABLET | Freq: Four times a day (QID) | ORAL | Status: DC | PRN
  Administered 2020-08-28 – 2020-09-01 (×4): 650 mg via ORAL
  Filled 2020-08-27 (×4): qty 2

## 2020-08-27 MED ORDER — LEVALBUTEROL HCL 1.25 MG/0.5ML IN NEBU: 1.2500 mg | INHALATION_SOLUTION | Freq: Once | RESPIRATORY_TRACT | Status: DC

## 2020-08-27 MED ORDER — METHYLPREDNISOLONE SODIUM SUCC 125 MG IJ SOLR
125.0000 mg | Freq: Once | INTRAMUSCULAR | Status: AC
  Administered 2020-08-27: 125 mg via INTRAVENOUS
  Filled 2020-08-27: qty 2

## 2020-08-27 MED ORDER — ENOXAPARIN SODIUM 40 MG/0.4ML ~~LOC~~ SOLN
40.0000 mg | Freq: Two times a day (BID) | SUBCUTANEOUS | Status: DC
  Administered 2020-08-27 – 2020-09-01 (×10): 40 mg via SUBCUTANEOUS
  Filled 2020-08-27 (×10): qty 0.4

## 2020-08-27 MED ORDER — RIBAVIRIN 200 MG PO CAPS: 400.0000 mg | ORAL_CAPSULE | Freq: Two times a day (BID) | ORAL | Status: DC

## 2020-08-27 MED ORDER — SODIUM CHLORIDE 0.9 % IV BOLUS
1000.0000 mL | Freq: Once | INTRAVENOUS | Status: AC
  Administered 2020-08-27: 1000 mL via INTRAVENOUS

## 2020-08-27 MED ORDER — IPRATROPIUM-ALBUTEROL 0.5-2.5 (3) MG/3ML IN SOLN
3.0000 mL | RESPIRATORY_TRACT | Status: DC
  Administered 2020-08-27 – 2020-08-29 (×11): 3 mL via RESPIRATORY_TRACT
  Filled 2020-08-27 (×12): qty 3

## 2020-08-27 MED ORDER — ONDANSETRON HCL 4 MG/2ML IJ SOLN: 4.0000 mg | Freq: Three times a day (TID) | INTRAMUSCULAR | Status: DC | PRN

## 2020-08-27 MED ORDER — ENOXAPARIN SODIUM 40 MG/0.4ML ~~LOC~~ SOLN: 40.0000 mg | SUBCUTANEOUS | Status: DC

## 2020-08-27 MED ORDER — ALBUTEROL SULFATE (2.5 MG/3ML) 0.083% IN NEBU
5.0000 mg | INHALATION_SOLUTION | Freq: Once | RESPIRATORY_TRACT | Status: AC
  Administered 2020-08-27: 5 mg via RESPIRATORY_TRACT
  Filled 2020-08-27: qty 6

## 2020-08-27 MED ORDER — DM-GUAIFENESIN ER 30-600 MG PO TB12: 1.0000 | ORAL_TABLET | Freq: Two times a day (BID) | ORAL | Status: DC | PRN

## 2020-08-27 MED ORDER — ALBUTEROL SULFATE (2.5 MG/3ML) 0.083% IN NEBU
2.5000 mg | INHALATION_SOLUTION | RESPIRATORY_TRACT | Status: DC | PRN
  Administered 2020-08-27: 2.5 mg via RESPIRATORY_TRACT
  Filled 2020-08-27: qty 3

## 2020-08-27 MED ORDER — IPRATROPIUM-ALBUTEROL 0.5-2.5 (3) MG/3ML IN SOLN
3.0000 mL | Freq: Once | RESPIRATORY_TRACT | Status: AC
  Administered 2020-08-27: 3 mL via RESPIRATORY_TRACT
  Filled 2020-08-27: qty 3

## 2020-08-27 MED ORDER — METHYLPREDNISOLONE SODIUM SUCC 125 MG IJ SOLR
60.0000 mg | Freq: Two times a day (BID) | INTRAMUSCULAR | Status: DC
  Administered 2020-08-27 – 2020-08-29 (×4): 60 mg via INTRAVENOUS
  Filled 2020-08-27 (×4): qty 2

## 2020-08-27 MED ORDER — SODIUM CHLORIDE 0.9 % IV SOLN: INTRAVENOUS | Status: DC

## 2020-08-27 MED ORDER — DOXYCYCLINE HYCLATE 100 MG PO TABS
100.0000 mg | ORAL_TABLET | Freq: Two times a day (BID) | ORAL | Status: DC
  Administered 2020-08-27: 100 mg via ORAL
  Filled 2020-08-27 (×3): qty 1

## 2020-08-27 MED ORDER — DOXYCYCLINE HYCLATE 100 MG PO TABS
100.0000 mg | ORAL_TABLET | Freq: Once | ORAL | Status: AC
  Administered 2020-08-27: 100 mg via ORAL
  Filled 2020-08-27: qty 1

## 2020-08-27 MED ORDER — SODIUM CHLORIDE 0.9 % IV BOLUS
1500.0000 mL | Freq: Once | INTRAVENOUS | Status: AC
  Administered 2020-08-27: 1500 mL via INTRAVENOUS

## 2020-08-27 NOTE — ED Provider Notes (Signed)
Patient continues to be slightly tachycardic, oxygen saturation about 94% on room air.  Continues to have diffuse end expiratory wheezing and slight dyspnea.  She is a little shaky now, will switch to Xopenex.  Provide additional treatment, given the ongoing extent of bronchospasm and persistent tachycardia slight ongoing dyspnea discussed with the patient and we will admit for further care.  Discussed with Dr. Clyde Lundborg of the hospitalist service, anticipate observation for further treatment   Sharyn Creamer, MD 08/27/20 1051

## 2020-08-27 NOTE — ED Provider Notes (Signed)
D-dimer noted normal.  No noted risk factors for pulmonary embolism or DVT  On examination patient alert, reports minimal shortness of breath feels improved.  Does feel like she is "wheezing".  Examination respirating comfortably, in no distress saturation 95% on room air.  She does demonstrate mild end expiratory wheezing in all lung fields.   Sharyn Creamer, MD 08/27/20 [phone]

## 2020-08-27 NOTE — ED Notes (Signed)
Blood cultures not obtained per lab. Abx already administered prior to receiving order for blood culture draw. Admitting MD notified. Awaiting further orders.

## 2020-08-27 NOTE — H&P (Addendum)
History and Physical    Christine Valencia AXK:[id number] DOB: [birth year] DOA: 08/27/2020  Referring MD/NP/PA:   PCP: Patient, No Pcp Per   Patient coming from:  The patient is coming from home.  At baseline, pt is independent for most of ADL.        Chief Complaint: SOB  HPI: Christine Valencia is a 29 y.o. female with medical history significant of PCOS, who presents with SOB.   Pt state she was seen in urgent care 5 days ago due to cough,  ear pain, congestion and postnasal drip.  Patient was tested negative for Covid.  Patient was treated with course of Z-Pak for possible ear and sinus infection, which she finished yesterday.  Patient states that she still has muffled sound on both ears.  She states that she developed shortness breath, which has been progressively worsening. She denies fever or chills.  She has chest tightness, but no chest pain.  Patient had nausea and vomited once yesterday.  Currently patient does not have nausea, vomiting, diarrhea or abdominal pain.  No symptoms of UTI or unilateral weakness. No history of asthma or COPD.  No history of CHF. Per ED physician, oxygen desaturation to lower 90s on ambulation in ED.  ED Course: pt was found to have RVP negative for Covid, but positive for RSV, negative D-dimer, WBC 11.2, troponin level 3, 3, electrolytes renal function okay, temperature normal, blood pressure 121/89, tachycardia with heart rate 110, RR 22, currently oxygen saturation 94% on room air.  Chest x-ray negative for infiltration.  Patient is placed on MedSurg bed for observation.  Review of Systems:   General: no fevers, chills, no body weight gain, has poor appetite, has fatigue HEENT: no blurry vision, sore throat. Has muffled sound in both ears Respiratory: dyspnea, coughing, wheezing CV: no chest pain, no palpitations GI: no nausea, vomiting, abdominal pain, diarrhea, constipation GU: no dysuria, burning on urination, increased urinary frequency, hematuria  Ext: no leg  edema Neuro: no unilateral weakness, numbness, or tingling, no vision change or hearing loss Skin: no rash, no skin tear. MSK: No muscle spasm, no deformity, no limitation of range of movement in spin Heme: No easy bruising.  Travel history: No recent long distant travel.  Allergy:  Allergies  Allergen Reactions  . Blueberry [Vaccinium Angustifolium] Hives  . Selective Estrogen Receptor Modulators Itching and Other (See Comments)    Reports "it feels like my body is on fire"    Past Medical History:  Diagnosis Date  . PCOS (polycystic ovarian syndrome)     Past Surgical History:  Procedure Laterality Date  . BREAST BIOPSY    . BREAST LUMPECTOMY  01/11/2011  . KNEE SURGERY Right 2010    Social History:  reports that she has quit smoking. She has never used smokeless tobacco. She reports current alcohol use. She reports that she does not use drugs.  Family History:  Family History  Problem Relation Age of Onset  . Breast cancer Mother   . Melanoma Father      Prior to Admission medications   Medication Sig Start Date End Date Taking? Authorizing Provider  amoxicillin-clavulanate (AUGMENTIN) 875-125 MG tablet Take 1 tablet by mouth 2 (two) times daily. 04/17/20   Tommie Sams, DO  azithromycin (ZITHROMAX) 250 MG tablet Take 1 tablet (250 mg total) by mouth daily. Take first 2 tablets together, then 1 every day until finished. 08/22/20   Mickie Bail, NP  loratadine (CLARITIN) 10 MG tablet Take 10  mg by mouth daily.    [provider]    Physical Exam: Vitals:   08/27/20 0535 08/27/20 0538 08/27/20 1145 08/27/20 1155  BP:  121/89  105/61  Pulse:  (!) 108 (!) 124 (!) 129  Resp:  20 17 18   Temp:      TempSrc:      SpO2: 95% 94% 97% 98%  Weight:      Height:       General: Not in acute distress HEENT:       Eyes: PERRL, EOMI, no scleral icterus.       ENT: No discharge from the ears and nose, no pharynx injection, no tonsillar enlargement.        Neck: No  JVD, no bruit, no mass felt. Heme: No neck lymph node enlargement. Cardiac: S1/S2, RRR, No murmurs, No gallops or rubs. Respiratory: Has wheezing bilaterally GI: Soft, nondistended, nontender, no rebound pain, no organomegaly, BS present. GU: No hematuria Ext: No pitting leg edema bilaterally. 1+DP/PT pulse bilaterally. Musculoskeletal: No joint deformities, No joint redness or warmth, no limitation of ROM in spin. Skin: No rashes.  Neuro: Alert, oriented X3, cranial nerves II-XII grossly intact, moves all extremities normally.  Psych: Patient is not psychotic, no suicidal or hemocidal ideation.  Labs on Admission: I have personally reviewed following labs and imaging studies  CBC: Recent Labs  Lab 08/26/20 2254  WBC 11.2*  HGB 14.6  HCT 42.9  MCV 86.3  PLT 304   Basic Metabolic Panel: Recent Labs  Lab 08/26/20 2254  NA 138  K 3.7  CL 102  CO2 26  GLUCOSE 90  BUN 17  CREATININE 1.06*  CALCIUM 9.0   GFR: Estimated Creatinine Clearance: 124.1 mL/min (A) (by C-G formula based on SCr of 1.06 mg/dL (H)). Liver Function Tests: No results for input(s): AST, ALT, ALKPHOS, BILITOT, PROT, ALBUMIN in the last 168 hours. No results for input(s): LIPASE, AMYLASE in the last 168 hours. No results for input(s): AMMONIA in the last 168 hours. Coagulation Profile: No results for input(s): INR, PROTIME in the last 168 hours. Cardiac Enzymes: No results for input(s): CKTOTAL, CKMB, CKMBINDEX, TROPONINI in the last 168 hours. BNP (last 3 results) No results for input(s): PROBNP in the last 8760 hours. HbA1C: No results for input(s): HGBA1C in the last 72 hours. CBG: No results for input(s): GLUCAP in the last 168 hours. Lipid Profile: No results for input(s): CHOL, HDL, LDLCALC, TRIG, CHOLHDL, LDLDIRECT in the last 72 hours. Thyroid Function Tests: No results for input(s): TSH, T4TOTAL, FREET4, T3FREE, THYROIDAB in the last 72 hours. Anemia Panel: No results for input(s):  VITAMINB12, FOLATE, FERRITIN, TIBC, IRON, RETICCTPCT in the last 72 hours. Urine analysis: No results found for: COLORURINE, APPEARANCEUR, LABSPEC, PHURINE, GLUCOSEU, HGBUR, BILIRUBINUR, KETONESUR, PROTEINUR, UROBILINOGEN, NITRITE, LEUKOCYTESUR Sepsis Labs: @LABRCNTIP (procalcitonin:4,lacticidven:4) ) Recent Results (from the past 240 hour(s))  Novel Coronavirus, NAA ([facility])     Status: None   Collection Time: 08/22/20  4:00 PM   Specimen: Nasal Swab; Nasopharyngeal(NP) swabs in vial transport medium   Nasopharynge  Patient  Result Value Ref Range Status   SARS-CoV-2, NAA Not Detected Not Detected Final    Comment: This nucleic acid amplification test was developed and its performance characteristics determined by [facility]. Nucleic acid amplification tests include RT-PCR and TMA. This test has not been FDA cleared or approved. This test has been authorized by FDA under an Emergency Use Authorization (EUA). This test is only authorized for the duration of  time the declaration that circumstances exist justifying the authorization of the emergency use of in vitro diagnostic tests for detection of SARS-CoV-2 virus and/or diagnosis of COVID-19 infection under section 564(b)(1) of the Act, 21 U.S.C. 696VEL-3(Y) (1), unless the authorization is terminated or revoked sooner. When diagnostic testing is negative, the possibility of a false negative result should be considered in the context of a patient's recent exposures and the presence of clinical signs and symptoms consistent with COVID-19. An individual without symptoms of COVID-19 and who is not shedding SARS-CoV-2 virus wo uld expect to have a negative (not detected) result in this assay.   SARS-COV-2, NAA 2 DAY TAT     Status: None   Collection Time: 08/22/20  4:00 PM   Nasopharynge  Patient  Result Value Ref Range Status   SARS-CoV-2, NAA 2 DAY TAT Performed  Final  Resp Panel by RT PCR (RSV, Flu A&B, Covid) -  Nasopharyngeal Swab     Status: Abnormal   Collection Time: 08/27/20  6:46 AM   Specimen: Nasopharyngeal Swab  Result Value Ref Range Status   SARS Coronavirus 2 by RT PCR NEGATIVE NEGATIVE Final    Comment: (NOTE) SARS-CoV-2 target nucleic acids are NOT DETECTED.  The SARS-CoV-2 RNA is generally detectable in upper respiratoy specimens during the acute phase of infection. The lowest concentration of SARS-CoV-2 viral copies this assay can detect is 131 copies/mL. A negative result does not preclude SARS-Cov-2 infection and should not be used as the sole basis for treatment or other patient management decisions. A negative result Thompson occur with  improper specimen collection/handling, submission of specimen other than nasopharyngeal swab, presence of viral mutation(s) within the areas targeted by this assay, and inadequate number of viral copies (<131 copies/mL). A negative result must be combined with clinical observations, patient history, and epidemiological information. The expected result is Negative.  Fact Sheet for Patients:  https://www.moore.com/  Fact Sheet for Healthcare Providers:  https://www.young.biz/  This test is no t yet approved or cleared by the [place] FDA and  has been authorized for detection and/or diagnosis of SARS-CoV-2 by FDA under an Emergency Use Authorization (EUA). This EUA will remain  in effect (meaning this test can be used) for the duration of the COVID-19 declaration under Section 564(b)(1) of the Act, 21 U.S.C. section 360bbb-3(b)(1), unless the authorization is terminated or revoked sooner.     Influenza A by PCR NEGATIVE NEGATIVE Final   Influenza B by PCR NEGATIVE NEGATIVE Final    Comment: (NOTE) The Xpert Xpress SARS-CoV-2/FLU/RSV assay is intended as an aid in  the diagnosis of influenza from Nasopharyngeal swab specimens and  should not be used as a sole basis for treatment. Nasal washings  and  aspirates are unacceptable for Xpert Xpress SARS-CoV-2/FLU/RSV  testing.  Fact Sheet for Patients: https://www.moore.com/  Fact Sheet for Healthcare Providers: https://www.young.biz/  This test is not yet approved or cleared by the [place] FDA and  has been authorized for detection and/or diagnosis of SARS-CoV-2 by  FDA under an Emergency Use Authorization (EUA). This EUA will remain  in effect (meaning this test can be used) for the duration of the  Covid-19 declaration under Section 564(b)(1) of the Act, 21  U.S.C. section 360bbb-3(b)(1), unless the authorization is  terminated or revoked.    Respiratory Syncytial Virus by PCR POSITIVE (A) NEGATIVE Final    Comment: (NOTE) Fact Sheet for Patients: https://www.moore.com/  Fact Sheet for Healthcare Providers: https://www.young.biz/  This test is not yet  approved or cleared by the [place] and  has been authorized for detection and/or diagnosis of SARS-CoV-2 by  FDA under an Emergency Use Authorization (EUA). This EUA will remain  in effect (meaning this test can be used) for the duration of the  COVID-19 declaration under Section 564(b)(1) of the Act, 21 U.S.C.  section 360bbb-3(b)(1), unless the authorization is terminated or  revoked. Performed at [facility], [address], [place], [place] [address]      Radiological Exams on Admission: DG Chest 2 View  Result Date: 08/26/2020 CLINICAL DATA:  Diagnosed with sinus infection on Tuesday. Finished antibiotics today but now feeling short of breath. COVID negative. EXAM: CHEST - 2 VIEW COMPARISON:  None. FINDINGS: The heart size and mediastinal contours are within normal limits. Both lungs are clear. The visualized skeletal structures are unremarkable. IMPRESSION: No active cardiopulmonary disease. Electronically Signed   By: Burman Nieves M.D.   On: 08/26/2020 23:09       EKG: Independently reviewed.  Sinus rhythm, QTC 462, no ischemic change   Assessment/Plan Principal Problem:   Acute bronchitis and bronchiolitis Active Problems:   Severe sepsis with septic shock (HCC)   Severe sepsis with septic shock and acute bronchitis and bronchiolitis due to RSV infection: Patient meets criteria for sepsis with tachycardia with heart rate of 110, tachypnea with RR 22.  Lactic acid 6.4  Patient has mild leukocytosis, will start doxycycline. Pt is not immunosuppressed.  Her symptoms have been going on for more than 5 days, noting in early stage, will not start Ribavarin.  -Will place to med-surg bed for observation -Bronchodilators -Solu-Medrol 60 mg IV tid - Doxycycline 100 mg twice daily -Mucinex for cough  -Incentive spirometry -Follow up blood culture x2, sputum culture -Nasal cannula oxygen as needed to maintain O2 saturation 93% or greater -will get Procalcitonin and trend lactic acid levels per sepsis protocol. -IVF: 3.5L of NS bolus, then 75 cc/h      DVT ppx: SQ Lovenox Code Status: Full code Family Communication:   Yes, patient's husband at bed side Disposition Plan:  Anticipate discharge back to previous environment Consults called:  none Admission status: Med-surg bed for obs  Status is: Observation  The patient remains OBS appropriate and will d/c before 2 midnights.  Dispo: The patient is from: Home              Anticipated d/c is to: Home              Anticipated d/c date is: 1 day              Patient currently is not medically stable to d/c.           Date of Service 08/27/2020    Lorretta Harp [facility]   If 7PM-7AM, please contact night-coverage [email or web address] 08/27/2020, 12:20 PM

## 2020-08-27 NOTE — ED Notes (Signed)
Pt was recently diagnosed with an ear infection, just finished z-pack yesterday.  Pt complaining of left jaw pain and decreased hearing bilaterally.

## 2020-08-27 NOTE — Progress Notes (Signed)
Anticoagulation monitoring(Lovenox):  29yo  female ordered Lovenox 40 mg Q24h for DVT prophylaxis  Filed Weights   08/26/20 2250  Weight: (!) 154.2 kg (340 lb)   BMI 53   Lab Results  Component Value Date   CREATININE 1.06 (H) 08/26/2020   Estimated Creatinine Clearance: 124.1 mL/min (A) (by C-G formula based on SCr of 1.06 mg/dL (H)). Hemoglobin & Hematocrit     Component Value Date/Time   HGB 14.6 08/26/2020 2254   HCT 42.9 08/26/2020 2254     Per Protocol for Patient with estCrcl < 30 ml/min and BMI > 40, will transition to Lovenox 40 mg Q12h.     Clovia Cuff, PharmD, BCPS 08/27/2020 7:48 PM

## 2020-08-27 NOTE — ED Provider Notes (Signed)
[facility] Emergency Department Provider Note  ____________________________________________  Time seen: Approximately 6:04 AM  I have reviewed the triage vital signs and the nursing notes.   HISTORY  Chief Complaint Shortness of Breath   HPI Christine Valencia is a 29 y.o. female presents for evaluation of shortness of breath.   Patient seen at urgent care 5 days ago for congestion, cough productive of yellow phlegm, left ear pain, and postnasal drip.  Had a negative Covid swab.  She was put on Z-Pak which she finished yesterday.  She has now developed shortness of breath.  The shortness of breath is constant, mild at rest and moderate with minimal ambulation.  Patient reports that she usually walks for 2 miles with her dogs and never had shortness of breath.  She denies fever or chills.  She has not been vaccinated for Covid.  She denies any known exposures to Covid.  She denies any chest pain but does report some tightness in her chest.  She denies any history of asthma, COPD or smoking.  She denies any personal or family history of blood clots, recent travel immobilization, leg pain or swelling, hemoptysis, or exogenous hormones.  Past Medical History:  Diagnosis Date   PCOS (polycystic ovarian syndrome)      Past Surgical History:  Procedure Laterality Date   BREAST BIOPSY     BREAST LUMPECTOMY  01/11/2011   KNEE SURGERY Right 2010    Prior to Admission medications   Medication Sig Start Date End Date Taking? Authorizing Provider  guaiFENesin (MUCINEX) 600 MG 12 hr tablet Take 1,200 mg by mouth 2 (two) times daily.   Yes [provider]  loratadine (CLARITIN) 10 MG tablet Take 10 mg by mouth daily.   Yes [provider]  azithromycin (ZITHROMAX) 250 MG tablet Take 1 tablet (250 mg total) by mouth daily. Take first 2 tablets together, then 1 every day until finished. 08/22/20   Mickie Bail, NP    Allergies Blueberry [vaccinium  angustifolium] and Selective estrogen receptor modulators  Family History  Problem Relation Age of Onset   Breast cancer Mother    Melanoma Father     Social History Social History   Tobacco Use   Smoking status: Former Smoker   Smokeless tobacco: Never Used  Building services engineer Use: Never used  Substance Use Topics   Alcohol use: Yes    Comment: occasionally   Drug use: Never    Review of Systems  Constitutional: Negative for fever. Eyes: Negative for visual changes. ENT: Negative for sore throat. Neck: No neck pain  Cardiovascular: Negative for chest pain. + chest tightness Respiratory: + shortness of breath, cough Gastrointestinal: Negative for abdominal pain, vomiting or diarrhea. Genitourinary: Negative for dysuria. Musculoskeletal: Negative for back pain. Skin: Negative for rash. Neurological: Negative for headaches, weakness or numbness. Psych: No SI or HI  ____________________________________________   PHYSICAL EXAM:  VITAL SIGNS: ED Triage Vitals [08/26/20 2250]  Enc Vitals Group     BP 134/90     Pulse Rate (!) 110     Resp (!) 22     Temp 97.7 F (36.5 C)     Temp Source Oral     SpO2 95 %     Weight (!) 340 lb (154.2 kg)     Height 5\' 7"  (1.702 m)     Head Circumference      Peak Flow      Pain Score 4  Pain Loc      Pain Edu?      Excl. in GC?     Constitutional: Alert and oriented. Well appearing and in no apparent distress. HEENT:      Head: Normocephalic and atraumatic.         Eyes: Conjunctivae are normal. Sclera is non-icteric.       Mouth/Throat: Mucous membranes are moist.       Neck: Supple with no signs of meningismus. Cardiovascular: Tachycardic with regular rhythm. Respiratory: Increased work of breathing, tachypneic, normal sats, lungs are clear to auscultation with no wheezing or crackles  gastrointestinal: Soft, non tender. Musculoskeletal: No edema, cyanosis, or erythema of extremities. Neurologic: Normal  speech and language. Face is symmetric. Moving all extremities. No gross focal neurologic deficits are appreciated. Skin: Skin is warm, dry and intact. No rash noted. Psychiatric: Mood and affect are normal. Speech and behavior are normal.  ____________________________________________   LABS (all labs ordered are listed, but only abnormal results are displayed)  Labs Reviewed  RESP PANEL BY RT PCR (RSV, FLU A&B, COVID) - Abnormal; Notable for the following components:      Result Value   Respiratory Syncytial Virus by PCR POSITIVE (*)    All other components within normal limits  BASIC METABOLIC PANEL - Abnormal; Notable for the following components:   Creatinine, Ser 1.06 (*)    All other components within normal limits  CBC - Abnormal; Notable for the following components:   WBC 11.2 (*)    All other components within normal limits  LACTIC ACID, PLASMA - Abnormal; Notable for the following components:   Lactic Acid, Venous 6.4 (*)    All other components within normal limits  LACTIC ACID, PLASMA - Abnormal; Notable for the following components:   Lactic Acid, Venous 8.9 (*)    All other components within normal limits  EXPECTORATED SPUTUM ASSESSMENT W REFEX TO RESP CULTURE  CULTURE, BLOOD (ROUTINE X 2)  CULTURE, BLOOD (ROUTINE X 2)  FIBRIN DERIVATIVES D-DIMER ([facility] ONLY)  BRAIN NATRIURETIC PEPTIDE  PROCALCITONIN  PREGNANCY, URINE  HIV ANTIBODY (ROUTINE TESTING W REFLEX)  CBC  COMPREHENSIVE METABOLIC PANEL  TROPONIN I (HIGH SENSITIVITY)  TROPONIN I (HIGH SENSITIVITY)   ____________________________________________  EKG  ED ECG REPORT I, Nita Sickle, the attending physician, personally viewed and interpreted this ECG.   Normal sinus rhythm, rate of 87, normal intervals, normal axis, no ST elevations or depressions. ____________________________________________  RADIOLOGY  CXR: PND ______________________________________   PROCEDURES  Procedure(s)  performed:yes .1-3 Lead EKG Interpretation Performed by: Nita Sickle, MD Authorized by: Nita Sickle, MD     Interpretation: non-specific     ECG rate assessment: tachycardic     Rhythm: sinus tachycardia     Ectopy: none     Critical Care performed:  None ____________________________________________   INITIAL IMPRESSION / ASSESSMENT AND PLAN / ED COURSE  29 y.o. female presents for evaluation of shortness of breath x 1 day the setting of being diagnosed with a sinus infection 5 days ago.  Patient is status post Z-Pak.  Patient looks dyspneic to with increased work of breathing, tachypneic but clear lungs on auscultation.  Patient was ambulated in the room with no hypoxia however her heart rate did go up to the 150s.  Differential diagnosis including bronchitis versus pneumonia versus Covid versus flu versus RSV versus PE versus pericarditis versus myocarditis.  Chest x-ray visualized by me with no infiltrates, confirmed by radiology.  EKG WNL.  Labs showing mild  leukocytosis with white count of 11.2, no signs of anemia.  No significant electrolyte derangements.  High-sensitivity troponin x1 -.  We will get a second troponin and a D-dimer.  Will treat with albuterol, steroids, and doxycycline for possible bronchitis.  Will give IV fluids for tachycardia.  Review of epic shows that patient has been tachycardic and all her prior visits to the emergency room although never as high as 150.  Patient placed on telemetry for close monitoring.  _________________________ 7:21 AM on 08/27/2020 -----------------------------------------  Repeat troponin, Covid swab, and D-dimer pending.  Care transferred to Dr. Fanny Bien. _____________________________________________ Please note:  Patient was evaluated in Emergency Department today for the symptoms described in the history of present illness. Patient was evaluated in the context of the global COVID-19 pandemic, which necessitated  consideration that the patient might be at risk for infection with the SARS-CoV-2 virus that causes COVID-19. Institutional protocols and algorithms that pertain to the evaluation of patients at risk for COVID-19 are in a state of rapid change based on information released by regulatory bodies including the CDC and federal and state organizations. These policies and algorithms were followed during the patient's care in the ED.  Some ED evaluations and interventions Chao be delayed as a result of limited staffing during the pandemic.   [place] Controlled Substance Database was reviewed by me. ____________________________________________   FINAL CLINICAL IMPRESSION(S) / ED DIAGNOSES   Final diagnoses:  Bronchial spasms  RSV bronchitis      NEW MEDICATIONS STARTED DURING THIS VISIT:  ED Discharge Orders    None       Note:  This document was prepared using Dragon voice recognition software and Maslow include unintentional dictation errors.    Don Perking, [place], MD 08/27/20 2325

## 2020-08-28 ENCOUNTER — Encounter: Payer: Self-pay | Admitting: Internal Medicine

## 2020-08-28 DIAGNOSIS — Y929 Unspecified place or not applicable: Secondary | ICD-10-CM | POA: Diagnosis not present

## 2020-08-28 DIAGNOSIS — J302 Other seasonal allergic rhinitis: Secondary | ICD-10-CM | POA: Diagnosis present

## 2020-08-28 DIAGNOSIS — J209 Acute bronchitis, unspecified: Secondary | ICD-10-CM | POA: Diagnosis not present

## 2020-08-28 DIAGNOSIS — R6521 Severe sepsis with septic shock: Secondary | ICD-10-CM | POA: Diagnosis present

## 2020-08-28 DIAGNOSIS — Z6841 Body Mass Index (BMI) 40.0 and over, adult: Secondary | ICD-10-CM | POA: Diagnosis not present

## 2020-08-28 DIAGNOSIS — R0982 Postnasal drip: Secondary | ICD-10-CM | POA: Diagnosis present

## 2020-08-28 DIAGNOSIS — J219 Acute bronchiolitis, unspecified: Secondary | ICD-10-CM | POA: Diagnosis not present

## 2020-08-28 DIAGNOSIS — J210 Acute bronchiolitis due to respiratory syncytial virus: Secondary | ICD-10-CM | POA: Diagnosis present

## 2020-08-28 DIAGNOSIS — E282 Polycystic ovarian syndrome: Secondary | ICD-10-CM | POA: Diagnosis present

## 2020-08-28 DIAGNOSIS — R9431 Abnormal electrocardiogram [ECG] [EKG]: Secondary | ICD-10-CM | POA: Diagnosis not present

## 2020-08-28 DIAGNOSIS — H9202 Otalgia, left ear: Secondary | ICD-10-CM | POA: Diagnosis present

## 2020-08-28 DIAGNOSIS — J9801 Acute bronchospasm: Secondary | ICD-10-CM | POA: Diagnosis present

## 2020-08-28 DIAGNOSIS — Z20822 Contact with and (suspected) exposure to covid-19: Secondary | ICD-10-CM | POA: Diagnosis present

## 2020-08-28 DIAGNOSIS — T380X5A Adverse effect of glucocorticoids and synthetic analogues, initial encounter: Secondary | ICD-10-CM | POA: Diagnosis present

## 2020-08-28 DIAGNOSIS — Z825 Family history of asthma and other chronic lower respiratory diseases: Secondary | ICD-10-CM | POA: Diagnosis not present

## 2020-08-28 DIAGNOSIS — Z9102 Food additives allergy status: Secondary | ICD-10-CM | POA: Diagnosis not present

## 2020-08-28 DIAGNOSIS — Z87891 Personal history of nicotine dependence: Secondary | ICD-10-CM | POA: Diagnosis not present

## 2020-08-28 DIAGNOSIS — I340 Nonrheumatic mitral (valve) insufficiency: Secondary | ICD-10-CM | POA: Diagnosis not present

## 2020-08-28 DIAGNOSIS — F419 Anxiety disorder, unspecified: Secondary | ICD-10-CM | POA: Diagnosis present

## 2020-08-28 DIAGNOSIS — J205 Acute bronchitis due to respiratory syncytial virus: Secondary | ICD-10-CM | POA: Diagnosis present

## 2020-08-28 DIAGNOSIS — A4189 Other specified sepsis: Secondary | ICD-10-CM | POA: Diagnosis present

## 2020-08-28 LAB — CBC
HCT: 37.7 % (ref 36.0–46.0)
Hemoglobin: 12.8 g/dL (ref 12.0–15.0)
MCH: 29.3 pg (ref 26.0–34.0)
MCHC: 34 g/dL (ref 30.0–36.0)
MCV: 86.3 fL (ref 80.0–100.0)
Platelets: 279 10*3/uL (ref 150–400)
RBC: 4.37 MIL/uL (ref 3.87–5.11)
RDW: 14.5 % (ref 11.5–15.5)
WBC: 19.4 10*3/uL — ABNORMAL HIGH (ref 4.0–10.5)
nRBC: 0 % (ref 0.0–0.2)

## 2020-08-28 LAB — COMPREHENSIVE METABOLIC PANEL
ALT: 17 U/L (ref 0–44)
AST: 21 U/L (ref 15–41)
Albumin: 3.6 g/dL (ref 3.5–5.0)
Alkaline Phosphatase: 63 U/L (ref 38–126)
Anion gap: 11 (ref 5–15)
BUN: 12 mg/dL (ref 6–20)
CO2: 18 mmol/L — ABNORMAL LOW (ref 22–32)
Calcium: 8.6 mg/dL — ABNORMAL LOW (ref 8.9–10.3)
Chloride: 109 mmol/L (ref 98–111)
Creatinine, Ser: 0.9 mg/dL (ref 0.44–1.00)
GFR calc Af Amer: >60 mL/min (ref >60)
GFR calc non Af Amer: >60 mL/min (ref >60)
Glucose, Bld: 138 mg/dL — ABNORMAL HIGH (ref 70–99)
Potassium: 4.2 mmol/L (ref 3.5–5.1)
Sodium: 138 mmol/L (ref 135–145)
Total Bilirubin: 0.6 mg/dL (ref 0.3–1.2)
Total Protein: 7.3 g/dL (ref 6.5–8.1)

## 2020-08-28 LAB — EXPECTORATED SPUTUM ASSESSMENT W GRAM STAIN, RFLX TO RESP C

## 2020-08-28 LAB — LACTIC ACID, PLASMA
Lactic Acid, Venous: 3.3 mmol/L (ref 0.5–1.9)
Lactic Acid, Venous: 3.1 mmol/L (ref 0.5–1.9)
Lactic Acid, Venous: 3.3 mmol/L (ref 0.5–1.9)
Lactic Acid, Venous: 5.3 mmol/L (ref 0.5–1.9)

## 2020-08-28 LAB — HIV ANTIBODY (ROUTINE TESTING W REFLEX): HIV Screen 4th Generation wRfx: NONREACTIVE

## 2020-08-28 MED ORDER — SODIUM CHLORIDE 0.9 % IV BOLUS
1000.0000 mL | Freq: Once | INTRAVENOUS | Status: AC
  Administered 2020-08-28: 1000 mL via INTRAVENOUS

## 2020-08-28 MED ORDER — LORAZEPAM 1 MG PO TABS
1.0000 mg | ORAL_TABLET | Freq: Once | ORAL | Status: AC
  Administered 2020-08-28: 1 mg via ORAL
  Filled 2020-08-28: qty 1

## 2020-08-28 MED ORDER — ALPRAZOLAM 0.25 MG PO TABS
0.2500 mg | ORAL_TABLET | Freq: Three times a day (TID) | ORAL | Status: DC | PRN
  Administered 2020-08-28 – 2020-08-31 (×8): 0.25 mg via ORAL
  Filled 2020-08-28 (×8): qty 1

## 2020-08-28 NOTE — ED Notes (Signed)
Pt transported to floor. This RN gave receiving RN pt's chart.

## 2020-08-28 NOTE — ED Notes (Signed)
Hospital bed requested.

## 2020-08-28 NOTE — ED Notes (Signed)
Pt appeared to be in emotional destress. This RN bedside. Pt spoke with this RN about stressors in her life, pt states "I feel like I am going to have a panic attack".  Denies SI/HI.  Admitting notified. New orders placed.

## 2020-08-28 NOTE — ED Notes (Signed)
Pt up to bathroom at this time

## 2020-08-28 NOTE — ED Notes (Signed)
Pt given anxiety meds as ordered and states that she feels better and is able to sleep.

## 2020-08-28 NOTE — ED Notes (Signed)
Pt's O2 sats decreased to 82-85% while sleeping. Pt on 2L of O2 while sleeping.

## 2020-08-28 NOTE — ED Notes (Signed)
Patient up to bathroom at this time.

## 2020-08-28 NOTE — Progress Notes (Signed)
Patient a/ox4. Patient stated her ears are clogged and she is having a hard time hearing. Patient having discomfort in her jaw. Pain 2/10.

## 2020-08-28 NOTE — Progress Notes (Signed)
PROGRESS NOTE    Christine Valencia  LZJ:[id number] DOB: 1991/07/12 DOA: 08/27/2020 PCP: Patient, No Pcp Per  Brief Narrative:  29 year old female history significant for PCOS, morbid obesity who presents with shortness of breath.  Found to be RSV positive.  Covid negative.  Initially treated with Z-Pak for possible ear and sinus infection.  Worsening shortness of breath on the day prior to presentation.  Chest tightness.  No chest pain.  Symptomatically improving over interval.  RSV positive.  No oxygen requirement.  No fevers over interval.  Procalcitonin negative.  Antibiotics stopped.  Chest x-ray negative.  Assessment & Plan:   Principal Problem:   Acute bronchitis and bronchiolitis Active Problems:   Severe sepsis with septic shock (HCC)  Severe sepsis and acute bronchitis and bronchiolitis due to RSV, improving Shock physiology resolved  Patient meets criteria for sepsis with tachycardia with heart rate of 110, tachypnea with RR 22.  Lactic acid 6.4  Patient has mild leukocytosis, will start doxycycline. Pt is not immunosuppressed.  Her symptoms have been going on for more than 5 days, noting in early stage, will not start Ribavarin. Plan: Continue inpatient admission Continue steroids, Solu-Medrol 60 mg IV twice daily -Will place to med-surg bed for observation -Bronchodilators, scheduled -DC doxycycline -Mucinex for cough  -Incentive spirometry -Follow up blood culture x2, sputum culture, no growth to date -Maintenance IV fluids -Oxygen if necessary  History of PCOS No acute issues  Morbid obesity BMI 53.25 Complicates overall care   DVT prophylaxis: Lovenox Code Status: Full Family Communication: None today.  Offered to call patient's husband.  Patient declined.  She will notify me if call is required Disposition Plan: Status is: Observation  The patient will require care spanning > 2 midnights and should be moved to inpatient because: Inpatient level of care  appropriate due to severity of illness  Dispo: The patient is from: Home              Anticipated d/c is to: Home              Anticipated d/c date is: 1 day              Patient currently is not medically stable to d/c.  Still symptomatic from RSV infection though improving over interval.  Continues on IV Solu-Medrol and frequent bronchodilators.  Anticipate medical readiness for discharge within 1 to 2 days.  No discharge barriers identified at this time.   Consultants:   None  Procedures:   None  Antimicrobials:      Subjective: Seen and examined.  Reports improvement in shortness of breath and respiratory symptoms.  Objective: Vitals:   08/28/20 0430 08/28/20 0659 08/28/20 0741 08/28/20 0754  BP: (!) 132/59 110/64  134/78  Pulse: (!) 118 (!) 109  (!) 108  Resp: 16 20  18   Temp: (!) 97.5 F (36.4 C) 97.8 F (36.6 C)  97.6 F (36.4 C)  TempSrc: Oral Oral  Oral  SpO2: 97% 96% 98% 97%  Weight:      Height:        Intake/Output Summary (Last 24 hours) at 08/28/2020 1019 Last data filed at 08/27/2020 1355 Gross per 24 hour  Intake 50 ml  Output 550 ml  Net -500 ml   Filed Weights   08/26/20 2250  Weight: (!) 154.2 kg    Examination:  General exam: Appears calm and comfortable.  Appears fatigued Respiratory system: scattered crackles bilaterally.  Normal work of breathing.  Room air  cardiovascular system: S1 & S2 heard, RRR. No JVD, murmurs, rubs, gallops or clicks. No pedal edema. Gastrointestinal system: Obese, nontender, nondistended, normal bowel sounds  Central nervous system: Alert and oriented. No focal neurological deficits. Extremities: Symmetric 5 x 5 power. Skin: No rashes, lesions or ulcers Psychiatry: Judgement and insight appear normal. Mood & affect appropriate.     Data Reviewed: I have personally reviewed following labs and imaging studies  CBC: Recent Labs  Lab 08/26/20 2254 08/28/20 0426  WBC 11.2* 19.4*  HGB 14.6 12.8  HCT  42.9 37.7  MCV 86.3 86.3  PLT 304 279   Basic Metabolic Panel: Recent Labs  Lab 08/26/20 2254 08/28/20 0426  NA 138 138  K 3.7 4.2  CL 102 109  CO2 26 18*  GLUCOSE 90 138*  BUN 17 12  CREATININE 1.06* 0.90  CALCIUM 9.0 8.6*   GFR: Estimated Creatinine Clearance: 146.2 mL/min (by C-G formula based on SCr of 0.9 mg/dL). Liver Function Tests: Recent Labs  Lab 08/28/20 0426  AST 21  ALT 17  ALKPHOS 63  BILITOT 0.6  PROT 7.3  ALBUMIN 3.6   No results for input(s): LIPASE, AMYLASE in the last 168 hours. No results for input(s): AMMONIA in the last 168 hours. Coagulation Profile: No results for input(s): INR, PROTIME in the last 168 hours. Cardiac Enzymes: No results for input(s): CKTOTAL, CKMB, CKMBINDEX, TROPONINI in the last 168 hours. BNP (last 3 results) No results for input(s): PROBNP in the last 8760 hours. HbA1C: No results for input(s): HGBA1C in the last 72 hours. CBG: No results for input(s): GLUCAP in the last 168 hours. Lipid Profile: No results for input(s): CHOL, HDL, LDLCALC, TRIG, CHOLHDL, LDLDIRECT in the last 72 hours. Thyroid Function Tests: No results for input(s): TSH, T4TOTAL, FREET4, T3FREE, THYROIDAB in the last 72 hours. Anemia Panel: No results for input(s): VITAMINB12, FOLATE, FERRITIN, TIBC, IRON, RETICCTPCT in the last 72 hours. Sepsis Labs: Recent Labs  Lab 08/27/20 1146 08/27/20 1146 08/27/20 1540 08/28/20 0205 08/28/20 0426 08/28/20 0752  PROCALCITON <0.10  --   --   --   --   --   LATICACIDVEN 6.4*   < > 8.9* 3.3* 3.1* 3.3*   < > = values in this interval not displayed.    Recent Results (from the past 240 hour(s))  Novel Coronavirus, NAA ([facility])     Status: None   Collection Time: 08/22/20  4:00 PM   Specimen: Nasal Swab; Nasopharyngeal(NP) swabs in vial transport medium   Nasopharynge  Patient  Result Value Ref Range Status   SARS-CoV-2, NAA Not Detected Not Detected Final    Comment: This nucleic acid amplification  test was developed and its performance characteristics determined by [employer]. Nucleic acid amplification tests include RT-PCR and TMA. This test has not been FDA cleared or approved. This test has been authorized by FDA under an Emergency Use Authorization (EUA). This test is only authorized for the duration of time the declaration that circumstances exist justifying the authorization of the emergency use of in vitro diagnostic tests for detection of SARS-CoV-2 virus and/or diagnosis of COVID-19 infection under section 564(b)(1) of the Act, 21 U.S.C. 220URK-2(H) (1), unless the authorization is terminated or revoked sooner. When diagnostic testing is negative, the possibility of a false negative result should be considered in the context of a patient's recent exposures and the presence of clinical signs and symptoms consistent with COVID-19. An individual without symptoms of COVID-19 and who is not shedding SARS-CoV-2  virus wo uld expect to have a negative (not detected) result in this assay.   SARS-COV-2, NAA 2 DAY TAT     Status: None   Collection Time: 08/22/20  4:00 PM   Nasopharynge  Patient  Result Value Ref Range Status   SARS-CoV-2, NAA 2 DAY TAT Performed  Final  Resp Panel by RT PCR (RSV, Flu A&B, Covid) - Nasopharyngeal Swab     Status: Abnormal   Collection Time: 08/27/20  6:46 AM   Specimen: Nasopharyngeal Swab  Result Value Ref Range Status   SARS Coronavirus 2 by RT PCR NEGATIVE NEGATIVE Final    Comment: (NOTE) SARS-CoV-2 target nucleic acids are NOT DETECTED.  The SARS-CoV-2 RNA is generally detectable in upper respiratoy specimens during the acute phase of infection. The lowest concentration of SARS-CoV-2 viral copies this assay can detect is 131 copies/mL. A negative result does not preclude SARS-Cov-2 infection and should not be used as the sole basis for treatment or other patient management decisions. A negative result Scholler occur with  improper  specimen collection/handling, submission of specimen other than nasopharyngeal swab, presence of viral mutation(s) within the areas targeted by this assay, and inadequate number of viral copies (<131 copies/mL). A negative result must be combined with clinical observations, patient history, and epidemiological information. The expected result is Negative.  Fact Sheet for Patients:  https://www.moore.com/  Fact Sheet for Healthcare Providers:  https://www.young.biz/  This test is no t yet approved or cleared by the [place] FDA and  has been authorized for detection and/or diagnosis of SARS-CoV-2 by FDA under an Emergency Use Authorization (EUA). This EUA will remain  in effect (meaning this test can be used) for the duration of the COVID-19 declaration under Section 564(b)(1) of the Act, 21 U.S.C. section 360bbb-3(b)(1), unless the authorization is terminated or revoked sooner.     Influenza A by PCR NEGATIVE NEGATIVE Final   Influenza B by PCR NEGATIVE NEGATIVE Final    Comment: (NOTE) The Xpert Xpress SARS-CoV-2/FLU/RSV assay is intended as an aid in  the diagnosis of influenza from Nasopharyngeal swab specimens and  should not be used as a sole basis for treatment. Nasal washings and  aspirates are unacceptable for Xpert Xpress SARS-CoV-2/FLU/RSV  testing.  Fact Sheet for Patients: https://www.moore.com/  Fact Sheet for Healthcare Providers: https://www.young.biz/  This test is not yet approved or cleared by the [place] FDA and  has been authorized for detection and/or diagnosis of SARS-CoV-2 by  FDA under an Emergency Use Authorization (EUA). This EUA will remain  in effect (meaning this test can be used) for the duration of the  Covid-19 declaration under Section 564(b)(1) of the Act, 21  U.S.C. section 360bbb-3(b)(1), unless the authorization is  terminated or revoked.     Respiratory Syncytial Virus by PCR POSITIVE (A) NEGATIVE Final    Comment: (NOTE) Fact Sheet for Patients: https://www.moore.com/  Fact Sheet for Healthcare Providers: https://www.young.biz/  This test is not yet approved or cleared by the [place] FDA and  has been authorized for detection and/or diagnosis of SARS-CoV-2 by  FDA under an Emergency Use Authorization (EUA). This EUA will remain  in effect (meaning this test can be used) for the duration of the  COVID-19 declaration under Section 564(b)(1) of the Act, 21 U.S.C.  section 360bbb-3(b)(1), unless the authorization is terminated or  revoked. Performed at [facility], [address] [place], [place] [address]   Culture, blood (routine x 2) Call MD if unable to  obtain prior to antibiotics being given     Status: None (Preliminary result)   Collection Time: 08/27/20  9:26 PM   Specimen: BLOOD  Result Value Ref Range Status   Specimen Description BLOOD LEFT AC  Final   Special Requests   Final    BOTTLES DRAWN AEROBIC AND ANAEROBIC Blood Culture adequate volume   Culture   Final    NO GROWTH < 12 HOURS Performed at [facility], [address] [place], [place] [address]    Report Status PENDING  Incomplete  Culture, blood (routine x 2) Call MD if unable to obtain prior to antibiotics being given     Status: None (Preliminary result)   Collection Time: 08/27/20  9:26 PM   Specimen: BLOOD LEFT HAND  Result Value Ref Range Status   Specimen Description BLOOD LEFT HAND  Final   Special Requests   Final    BOTTLES DRAWN AEROBIC AND ANAEROBIC Blood Culture adequate volume   Culture   Final    NO GROWTH < 12 HOURS Performed at [facility], [address] [place], [place] [address]    Report Status PENDING  Incomplete  Culture, sputum-assessment     Status: None   Collection Time: 08/28/20  5:30 AM   Specimen: Sputum  Result Value Ref Range Status     Specimen Description SPUTUM  Final   Special Requests NONE  Final   Sputum evaluation   Final    Sputum specimen not acceptable for testing.  Please recollect.   CALLED TO STEPHANIE RUDD AT [phone] ON 08/28/2020 [facility]. Performed at [facility], [address] [place], [place] [address]    Report Status 08/28/2020 FINAL  Final         Radiology Studies: DG Chest 2 View  Result Date: 08/26/2020 CLINICAL DATA:  Diagnosed with sinus infection on Tuesday. Finished antibiotics today but now feeling short of breath. COVID negative. EXAM: CHEST - 2 VIEW COMPARISON:  None. FINDINGS: The heart size and mediastinal contours are within normal limits. Both lungs are clear. The visualized skeletal structures are unremarkable. IMPRESSION: No active cardiopulmonary disease. Electronically Signed   By: Burman NievesWilliam  Stevens M.D.   On: 08/26/2020 23:09        Scheduled Meds: . doxycycline  100 mg Oral Q12H  . enoxaparin (LOVENOX) injection  40 mg Subcutaneous Q12H  . ipratropium-albuterol  3 mL Nebulization Q4H  . loratadine  10 mg Oral Daily  . methylPREDNISolone (SOLU-MEDROL) injection  60 mg Intravenous Q12H   Continuous Infusions: . sodium chloride 125 mL/hr at 08/28/20 0206     LOS: 0 days    Time spent: 25 minutes    Tresa MooreSudheer B Aaralyn Kil, MD [facility] Pager [phone]  If 7PM-7AM, please contact night-coverage 08/28/2020, 10:19 AM

## 2020-08-28 NOTE — ED Notes (Addendum)
Pt up to bathroom.  Pt provided with hospital bed. Pt on phone with husband at this time.

## 2020-08-29 LAB — BASIC METABOLIC PANEL
Anion gap: 9 (ref 5–15)
BUN: 11 mg/dL (ref 6–20)
CO2: 21 mmol/L — ABNORMAL LOW (ref 22–32)
Calcium: 8.6 mg/dL — ABNORMAL LOW (ref 8.9–10.3)
Chloride: 108 mmol/L (ref 98–111)
Creatinine, Ser: 0.78 mg/dL (ref 0.44–1.00)
GFR calc Af Amer: >60 mL/min (ref >60)
GFR calc non Af Amer: >60 mL/min (ref >60)
Glucose, Bld: 133 mg/dL — ABNORMAL HIGH (ref 70–99)
Potassium: 4 mmol/L (ref 3.5–5.1)
Sodium: 138 mmol/L (ref 135–145)

## 2020-08-29 LAB — CBC WITH DIFFERENTIAL/PLATELET
Abs Immature Granulocytes: 0.59 10*3/uL — ABNORMAL HIGH (ref 0.00–0.07)
Basophils Absolute: 0 10*3/uL (ref 0.0–0.1)
Basophils Relative: 0 %
Eosinophils Absolute: 0 10*3/uL (ref 0.0–0.5)
Eosinophils Relative: 0 %
HCT: 37.8 % (ref 36.0–46.0)
Hemoglobin: 12.6 g/dL (ref 12.0–15.0)
Immature Granulocytes: 3 %
Lymphocytes Relative: 8 %
Lymphs Abs: 1.7 10*3/uL (ref 0.7–4.0)
MCH: 29.2 pg (ref 26.0–34.0)
MCHC: 33.3 g/dL (ref 30.0–36.0)
MCV: 87.7 fL (ref 80.0–100.0)
Monocytes Absolute: 0.7 10*3/uL (ref 0.1–1.0)
Monocytes Relative: 3 %
Neutro Abs: 19.9 10*3/uL — ABNORMAL HIGH (ref 1.7–7.7)
Neutrophils Relative %: 86 %
Platelets: 286 10*3/uL (ref 150–400)
RBC: 4.31 MIL/uL (ref 3.87–5.11)
RDW: 15.2 % (ref 11.5–15.5)
WBC: 23 10*3/uL — ABNORMAL HIGH (ref 4.0–10.5)
nRBC: 0 % (ref 0.0–0.2)

## 2020-08-29 MED ORDER — METHYLPREDNISOLONE SODIUM SUCC 40 MG IJ SOLR
40.0000 mg | Freq: Two times a day (BID) | INTRAMUSCULAR | Status: DC
  Administered 2020-08-29 – 2020-08-31 (×5): 40 mg via INTRAVENOUS
  Filled 2020-08-29 (×5): qty 1

## 2020-08-29 MED ORDER — IPRATROPIUM-ALBUTEROL 0.5-2.5 (3) MG/3ML IN SOLN
3.0000 mL | Freq: Four times a day (QID) | RESPIRATORY_TRACT | Status: DC
  Administered 2020-08-29 – 2020-09-01 (×12): 3 mL via RESPIRATORY_TRACT
  Filled 2020-08-29 (×12): qty 3

## 2020-08-29 NOTE — Progress Notes (Signed)
PROGRESS NOTE    Christine Valencia  IOE:[id number] DOB: 1991/05/11 DOA: 08/27/2020 PCP: Patient, No Pcp Per  Brief Narrative:  29 year old female history significant for PCOS, morbid obesity who presents with shortness of breath.  Found to be RSV positive.  Covid negative.  Initially treated with Z-Pak for possible ear and sinus infection.  Worsening shortness of breath on the day prior to presentation.  Chest tightness.  No chest pain.  Symptomatically improving over interval.  RSV positive.  No oxygen requirement.  No fevers over interval.  Procalcitonin negative.  Antibiotics stopped.  Chest x-ray negative.  Patient has had issues with anxiety.  Seems better now  Assessment & Plan:   Principal Problem:   Acute bronchitis and bronchiolitis Active Problems:   Severe sepsis with septic shock (HCC)   RSV (acute bronchiolitis due to respiratory syncytial virus)  Severe sepsis and acute bronchitis and bronchiolitis due to RSV, improving Shock physiology resolved  Patient meets criteria for sepsis with tachycardia with heart rate of 110, tachypnea with RR 22.  Lactic acid 6.4  Patient has mild leukocytosis, will start doxycycline. Pt is not immunosuppressed.  Her symptoms have been going on for more than 5 days, noting in early stage, will not start Ribavarin. Plan: - Continue steroids, Solu-Medrol, dose decreased to 40 mg IV twice daily.  Continue to wean down rapidly -Will place to med-surg bed for observation -Bronchodilators, scheduled -DC doxycycline -Mucinex for cough  -Incentive spirometry -Follow up blood culture x2, sputum culture, no growth to date -Maintenance IV fluids, rate decreased to 75 cc/h -Oxygen if necessary  History of PCOS No acute issues  Morbid obesity BMI 53.25 Complicates overall care   DVT prophylaxis: Lovenox Code Status: Full Family Communication: None today.  Offered to call patient's husband.  Patient declined.  She will notify me if call is  required Disposition Plan: Status is: Inpatient  Remains inpatient appropriate because:Inpatient level of care appropriate due to severity of illness   Dispo: The patient is from: Home              Anticipated d/c is to: Home              Anticipated d/c date is: 2 days              Patient currently is not medically stable to d/c.   Still symptomatic from RSV infection.  Remains tachycardic and mildly tachypneic.  Breath sounds improving.  Respiratory status slowly improving.  Anticipate 1 to 2 days of additional inpatient treatment and monitoring prior to disposition planning.  Anticipated discharge to home.  No discharge barriers anticipated.  Consultants:   None  Procedures:   None  Antimicrobials:      Subjective: Seen and examined.  Still tachycardic and somewhat anxious.  Improved respiratory status Objective: Vitals:   08/28/20 1551 08/28/20 2056 08/28/20 2325 08/29/20 0808  BP:   (!) 141/77 127/72  Pulse:   (!) 108 94  Resp:   19 14  Temp:   98.6 F (37 C) 97.9 F (36.6 C)  TempSrc:   Oral Oral  SpO2: 95% 96% 97% 96%  Weight:      Height:        Intake/Output Summary (Last 24 hours) at 08/29/2020 1049 Last data filed at 08/29/2020 0500 Gross per 24 hour  Intake --  Output 1050 ml  Net -1050 ml   Filed Weights   08/26/20 2250  Weight: (!) 154.2 kg    Examination:  General exam: No acute distress.  Appears fatigued Respiratory system: Bibasilar crackles.  Tachypneic.  Normal work of breathing.  Room air  cardiovascular system: S1 & S2 heard, RRR. No JVD, murmurs, rubs, gallops or clicks. No pedal edema. Gastrointestinal system: Obese, nontender, nondistended, normal bowel sounds  Central nervous system: Alert and oriented. No focal neurological deficits. Extremities: Symmetric 5 x 5 power. Skin: No rashes, lesions or ulcers Psychiatry: Judgement and insight appear normal. Mood & affect appropriate.     Data Reviewed: I have personally reviewed  following labs and imaging studies  CBC: Recent Labs  Lab 08/26/20 2254 08/28/20 0426 08/29/20 0329  WBC 11.2* 19.4* 23.0*  NEUTROABS  --   --  19.9*  HGB 14.6 12.8 12.6  HCT 42.9 37.7 37.8  MCV 86.3 86.3 87.7  PLT 304 279 286   Basic Metabolic Panel: Recent Labs  Lab 08/26/20 2254 08/28/20 0426 08/29/20 0329  NA 138 138 138  K 3.7 4.2 4.0  CL 102 109 108  CO2 26 18* 21*  GLUCOSE 90 138* 133*  BUN 17 12 11   CREATININE 1.06* 0.90 0.78  CALCIUM 9.0 8.6* 8.6*   GFR: Estimated Creatinine Clearance: 164.4 mL/min (by C-G formula based on SCr of 0.78 mg/dL). Liver Function Tests: Recent Labs  Lab 08/28/20 0426  AST 21  ALT 17  ALKPHOS 63  BILITOT 0.6  PROT 7.3  ALBUMIN 3.6   No results for input(s): LIPASE, AMYLASE in the last 168 hours. No results for input(s): AMMONIA in the last 168 hours. Coagulation Profile: No results for input(s): INR, PROTIME in the last 168 hours. Cardiac Enzymes: No results for input(s): CKTOTAL, CKMB, CKMBINDEX, TROPONINI in the last 168 hours. BNP (last 3 results) No results for input(s): PROBNP in the last 8760 hours. HbA1C: No results for input(s): HGBA1C in the last 72 hours. CBG: No results for input(s): GLUCAP in the last 168 hours. Lipid Profile: No results for input(s): CHOL, HDL, LDLCALC, TRIG, CHOLHDL, LDLDIRECT in the last 72 hours. Thyroid Function Tests: No results for input(s): TSH, T4TOTAL, FREET4, T3FREE, THYROIDAB in the last 72 hours. Anemia Panel: No results for input(s): VITAMINB12, FOLATE, FERRITIN, TIBC, IRON, RETICCTPCT in the last 72 hours. Sepsis Labs: Recent Labs  Lab 08/27/20 1146 08/27/20 1540 08/28/20 0205 08/28/20 0426 08/28/20 0752 08/28/20 1215  PROCALCITON <0.10  --   --   --   --   --   LATICACIDVEN 6.4*   < > 3.3* 3.1* 3.3* 5.3*   < > = values in this interval not displayed.    Recent Results (from the past 240 hour(s))  Novel Coronavirus, NAA ([facility])     Status: None   Collection  Time: 08/22/20  4:00 PM   Specimen: Nasal Swab; Nasopharyngeal(NP) swabs in vial transport medium   Nasopharynge  Patient  Result Value Ref Range Status   SARS-CoV-2, NAA Not Detected Not Detected Final    Comment: This nucleic acid amplification test was developed and its performance characteristics determined by 08/24/20. Nucleic acid amplification tests include RT-PCR and TMA. This test has not been FDA cleared or approved. This test has been authorized by FDA under an Emergency Use Authorization (EUA). This test is only authorized for the duration of time the declaration that circumstances exist justifying the authorization of the emergency use of in vitro diagnostic tests for detection of SARS-CoV-2 virus and/or diagnosis of COVID-19 infection under section 564(b)(1) of the Act, 21 U.S.C. [employer]) (1), unless the authorization is terminated  or revoked sooner. When diagnostic testing is negative, the possibility of a false negative result should be considered in the context of a patient's recent exposures and the presence of clinical signs and symptoms consistent with COVID-19. An individual without symptoms of COVID-19 and who is not shedding SARS-CoV-2 virus wo uld expect to have a negative (not detected) result in this assay.   SARS-COV-2, NAA 2 DAY TAT     Status: None   Collection Time: 08/22/20  4:00 PM   Nasopharynge  Patient  Result Value Ref Range Status   SARS-CoV-2, NAA 2 DAY TAT Performed  Final  Resp Panel by RT PCR (RSV, Flu A&B, Covid) - Nasopharyngeal Swab     Status: Abnormal   Collection Time: 08/27/20  6:46 AM   Specimen: Nasopharyngeal Swab  Result Value Ref Range Status   SARS Coronavirus 2 by RT PCR NEGATIVE NEGATIVE Final    Comment: (NOTE) SARS-CoV-2 target nucleic acids are NOT DETECTED.  The SARS-CoV-2 RNA is generally detectable in upper respiratoy specimens during the acute phase of infection. The lowest concentration of SARS-CoV-2  viral copies this assay can detect is 131 copies/mL. A negative result does not preclude SARS-Cov-2 infection and should not be used as the sole basis for treatment or other patient management decisions. A negative result [place] occur with  improper specimen collection/handling, submission of specimen other than nasopharyngeal swab, presence of viral mutation(s) within the areas targeted by this assay, and inadequate number of viral copies (<131 copies/mL). A negative result must be combined with clinical observations, patient history, and epidemiological information. The expected result is Negative.  Fact Sheet for Patients:  https://www.moore.com/https://www.fda.gov/media/142436/download  Fact Sheet for Healthcare Providers:  https://www.young.biz/https://www.fda.gov/media/142435/download  This test is no t yet approved or cleared by the [place] FDA and  has been authorized for detection and/or diagnosis of SARS-CoV-2 by FDA under an Emergency Use Authorization (EUA). This EUA will remain  in effect (meaning this test can be used) for the duration of the COVID-19 declaration under Section 564(b)(1) of the Act, 21 U.S.C. section 360bbb-3(b)(1), unless the authorization is terminated or revoked sooner.     Influenza A by PCR NEGATIVE NEGATIVE Final   Influenza B by PCR NEGATIVE NEGATIVE Final    Comment: (NOTE) The Xpert Xpress SARS-CoV-2/FLU/RSV assay is intended as an aid in  the diagnosis of influenza from Nasopharyngeal swab specimens and  should not be used as a sole basis for treatment. Nasal washings and  aspirates are unacceptable for Xpert Xpress SARS-CoV-2/FLU/RSV  testing.  Fact Sheet for Patients: https://www.moore.com/https://www.fda.gov/media/142436/download  Fact Sheet for Healthcare Providers: https://www.young.biz/https://www.fda.gov/media/142435/download  This test is not yet approved or cleared by the [place] FDA and  has been authorized for detection and/or diagnosis of SARS-CoV-2 by  FDA under an Emergency Use Authorization (EUA).  This EUA will remain  in effect (meaning this test can be used) for the duration of the  Covid-19 declaration under Section 564(b)(1) of the Act, 21  U.S.C. section 360bbb-3(b)(1), unless the authorization is  terminated or revoked.    Respiratory Syncytial Virus by PCR POSITIVE (A) NEGATIVE Final    Comment: (NOTE) Fact Sheet for Patients: https://www.moore.com/https://www.fda.gov/media/142436/download  Fact Sheet for Healthcare Providers: https://www.young.biz/https://www.fda.gov/media/142435/download  This test is not yet approved or cleared by the [place] FDA and  has been authorized for detection and/or diagnosis of SARS-CoV-2 by  FDA under an Emergency Use Authorization (EUA). This EUA will remain  in effect (meaning this test can be used) for the  duration of the  COVID-19 declaration under Section 564(b)(1) of the Act, 21 U.S.C.  section 360bbb-3(b)(1), unless the authorization is terminated or  revoked. Performed at [facility], [address] [place], [place] [address]   Culture, blood (routine x 2) Call MD if unable to obtain prior to antibiotics being given     Status: None (Preliminary result)   Collection Time: 08/27/20  9:26 PM   Specimen: BLOOD  Result Value Ref Range Status   Specimen Description BLOOD LEFT [facility]  Final   Special Requests   Final    BOTTLES DRAWN AEROBIC AND ANAEROBIC Blood Culture adequate volume   Culture   Final    NO GROWTH 2 DAYS Performed at [facility], [address] [place], [place] [address]    Report Status PENDING  Incomplete  Culture, blood (routine x 2) Call MD if unable to obtain prior to antibiotics being given     Status: None (Preliminary result)   Collection Time: 08/27/20  9:26 PM   Specimen: BLOOD LEFT HAND  Result Value Ref Range Status   Specimen Description BLOOD LEFT HAND  Final   Special Requests   Final    BOTTLES DRAWN AEROBIC AND ANAEROBIC Blood Culture adequate volume   Culture   Final    NO GROWTH 2 DAYS Performed at [facility], [address], [place], [place] [address]    Report Status PENDING  Incomplete  Culture, sputum-assessment     Status: None   Collection Time: 08/28/20  5:30 AM   Specimen: Sputum  Result Value Ref Range Status   Specimen Description SPUTUM  Final   Special Requests NONE  Final   Sputum evaluation   Final    Sputum specimen not acceptable for testing.  Please recollect.   CALLED TO STEPHANIE RUDD AT 0645 ON 08/28/2020 [facility]. Performed at [facility], [address] [place], [place] [address]    Report Status 08/28/2020 FINAL  Final         Radiology Studies: No results found.      Scheduled Meds: . enoxaparin (LOVENOX) injection  40 mg Subcutaneous Q12H  . ipratropium-albuterol  3 mL Nebulization Q4H  . loratadine  10 mg Oral Daily  . methylPREDNISolone (SOLU-MEDROL) injection  40 mg Intravenous Q12H   Continuous Infusions: . sodium chloride 75 mL/hr at 08/29/20 0939     LOS: 1 day    Time spent: 25 minutes    Tresa Moore, MD [facility] Pager [phone]  If 7PM-7AM, please contact night-coverage 08/29/2020, 10:49 AM

## 2020-08-30 LAB — BASIC METABOLIC PANEL
Anion gap: 10 (ref 5–15)
BUN: 13 mg/dL (ref 6–20)
CO2: 23 mmol/L (ref 22–32)
Calcium: 8.5 mg/dL — ABNORMAL LOW (ref 8.9–10.3)
Chloride: 104 mmol/L (ref 98–111)
Creatinine, Ser: 0.92 mg/dL (ref 0.44–1.00)
GFR calc Af Amer: >60 mL/min (ref >60)
GFR calc non Af Amer: >60 mL/min (ref >60)
Glucose, Bld: 124 mg/dL — ABNORMAL HIGH (ref 70–99)
Potassium: 4.5 mmol/L (ref 3.5–5.1)
Sodium: 137 mmol/L (ref 135–145)

## 2020-08-30 LAB — CBC WITH DIFFERENTIAL/PLATELET
Abs Immature Granulocytes: 0.84 10*3/uL — ABNORMAL HIGH (ref 0.00–0.07)
Basophils Absolute: 0.1 10*3/uL (ref 0.0–0.1)
Basophils Relative: 0 %
Eosinophils Absolute: 0 10*3/uL (ref 0.0–0.5)
Eosinophils Relative: 0 %
HCT: 36.3 % (ref 36.0–46.0)
Hemoglobin: 12.7 g/dL (ref 12.0–15.0)
Immature Granulocytes: 4 %
Lymphocytes Relative: 11 %
Lymphs Abs: 2.4 10*3/uL (ref 0.7–4.0)
MCH: 29.4 pg (ref 26.0–34.0)
MCHC: 35 g/dL (ref 30.0–36.0)
MCV: 84 fL (ref 80.0–100.0)
Monocytes Absolute: 0.9 10*3/uL (ref 0.1–1.0)
Monocytes Relative: 4 %
Neutro Abs: 17 10*3/uL — ABNORMAL HIGH (ref 1.7–7.7)
Neutrophils Relative %: 81 %
Platelets: 282 10*3/uL (ref 150–400)
RBC: 4.32 MIL/uL (ref 3.87–5.11)
RDW: 15.1 % (ref 11.5–15.5)
WBC: 21.1 10*3/uL — ABNORMAL HIGH (ref 4.0–10.5)
nRBC: 0.1 % (ref 0.0–0.2)

## 2020-08-30 LAB — BRAIN NATRIURETIC PEPTIDE: B Natriuretic Peptide: 124.4 pg/mL — ABNORMAL HIGH (ref 0.0–100.0)

## 2020-08-30 LAB — LACTIC ACID, PLASMA
Lactic Acid, Venous: 3.3 mmol/L (ref 0.5–1.9)
Lactic Acid, Venous: 3.3 mmol/L (ref 0.5–1.9)

## 2020-08-30 LAB — FIBRIN DERIVATIVES D-DIMER (ARMC ONLY): Fibrin derivatives D-dimer (ARMC): 132.15 ng/mL (FEU) (ref 0.00–499.00)

## 2020-08-30 LAB — TSH: TSH: 0.336 u[IU]/mL — ABNORMAL LOW (ref 0.350–4.500)
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
## 2020-08-30 NOTE — Progress Notes (Signed)
PROGRESS NOTE    Christine Valencia  WUJ:[id number] DOB: [birth year] DOA: 08/27/2020 PCP: Patient, No Pcp Per    Brief Narrative:  29 year old female history significant for PCOS, morbid obesity who presents with shortness of breath.  Found to be RSV positive.  Covid negative.  Initially treated with Z-Pak for possible ear and sinus infection.  Worsening shortness of breath on the day prior to presentation.  Chest tightness.  No chest pain.     Consultants:     Procedures:   Antimicrobials:   doxycycline -d/c'd   Subjective: Still breathless with walking. On tele sinus tachy 120-140's. She is ok if she doesn't move in bed.   Objective: Vitals:   08/29/20 2025 08/29/20 2339 08/30/20 0753 08/30/20 0813  BP:  135/79 (!) 148/94   Pulse:  (!) 105 96   Resp:  20 18   Temp:  98.3 F (36.8 C) 97.6 F (36.4 C)   TempSrc:  Oral Oral   SpO2: 96% 95% 96% 98%  Weight:      Height:        Intake/Output Summary (Last 24 hours) at 08/30/2020 1453 Last data filed at 08/30/2020 1013 Gross per 24 hour  Intake 1035 ml  Output --  Net 1035 ml   Filed Weights   08/26/20 2250  Weight: (!) 154.2 kg    Examination:  General exam: Appears calm and comfortable , MO. Respiratory system: Clear to auscultation. Respiratory effort normal. Cardiovascular system: S1 & S2 heard, RRR. No JVD, murmurs, rubs, gallops or clicks.  Gastrointestinal system: Abdomen is nondistended, soft and nontender.  Normal bowel sounds heard. Central nervous system: Alert and oriented. No focal neurological deficits. Extremities: no edema Skin: warm, dry Psychiatry: Judgement and insight appear normal. Mood & affect appropriate.     Data Reviewed: I have personally reviewed following labs and imaging studies  CBC: Recent Labs  Lab 08/26/20 2254 08/28/20 0426 08/29/20 0329 08/30/20 0522  WBC 11.2* 19.4* 23.0* 21.1*  NEUTROABS  --   --  19.9* 17.0*  HGB 14.6 12.8 12.6 12.7  HCT 42.9 37.7 37.8 36.3  MCV  86.3 86.3 87.7 84.0  PLT 304 279 286 282   Basic Metabolic Panel: Recent Labs  Lab 08/26/20 2254 08/28/20 0426 08/29/20 0329 08/30/20 0522  NA 138 138 138 137  K 3.7 4.2 4.0 4.5  CL 102 109 108 104  CO2 26 18* 21* 23  GLUCOSE 90 138* 133* 124*  BUN 17 12 11 13   CREATININE 1.06* 0.90 0.78 0.92  CALCIUM 9.0 8.6* 8.6* 8.5*   GFR: Estimated Creatinine Clearance: 143 mL/min (by C-G formula based on SCr of 0.92 mg/dL). Liver Function Tests: Recent Labs  Lab 08/28/20 0426  AST 21  ALT 17  ALKPHOS 63  BILITOT 0.6  PROT 7.3  ALBUMIN 3.6   No results for input(s): LIPASE, AMYLASE in the last 168 hours. No results for input(s): AMMONIA in the last 168 hours. Coagulation Profile: No results for input(s): INR, PROTIME in the last 168 hours. Cardiac Enzymes: No results for input(s): CKTOTAL, CKMB, CKMBINDEX, TROPONINI in the last 168 hours. BNP (last 3 results) No results for input(s): PROBNP in the last 8760 hours. HbA1C: No results for input(s): HGBA1C in the last 72 hours. CBG: No results for input(s): GLUCAP in the last 168 hours. Lipid Profile: No results for input(s): CHOL, HDL, LDLCALC, TRIG, CHOLHDL, LDLDIRECT in the last 72 hours. Thyroid Function Tests: No results for input(s): TSH, T4TOTAL, FREET4, T3FREE, THYROIDAB in  the last 72 hours. Anemia Panel: No results for input(s): VITAMINB12, FOLATE, FERRITIN, TIBC, IRON, RETICCTPCT in the last 72 hours. Sepsis Labs: Recent Labs  Lab 08/27/20 1146 08/27/20 1540 08/28/20 0205 08/28/20 0426 08/28/20 0752 08/28/20 1215  PROCALCITON <0.10  --   --   --   --   --   LATICACIDVEN 6.4*   < > 3.3* 3.1* 3.3* 5.3*   < > = values in this interval not displayed.    Recent Results (from the past 240 hour(s))  Novel Coronavirus, NAA ([facility])     Status: None   Collection Time: 08/22/20  4:00 PM   Specimen: Nasal Swab; Nasopharyngeal(NP) swabs in vial transport medium   Nasopharynge  Patient  Result Value Ref Range  Status   SARS-CoV-2, NAA Not Detected Not Detected Final    Comment: This nucleic acid amplification test was developed and its performance characteristics determined by [employer]. Nucleic acid amplification tests include RT-PCR and TMA. This test has not been FDA cleared or approved. This test has been authorized by FDA under an Emergency Use Authorization (EUA). This test is only authorized for the duration of time the declaration that circumstances exist justifying the authorization of the emergency use of in vitro diagnostic tests for detection of SARS-CoV-2 virus and/or diagnosis of COVID-19 infection under section 564(b)(1) of the Act, 21 U.S.C. 762UQJ-3(H) (1), unless the authorization is terminated or revoked sooner. When diagnostic testing is negative, the possibility of a false negative result should be considered in the context of a patient's recent exposures and the presence of clinical signs and symptoms consistent with COVID-19. An individual without symptoms of COVID-19 and who is not shedding SARS-CoV-2 virus wo uld expect to have a negative (not detected) result in this assay.   SARS-COV-2, NAA 2 DAY TAT     Status: None   Collection Time: 08/22/20  4:00 PM   Nasopharynge  Patient  Result Value Ref Range Status   SARS-CoV-2, NAA 2 DAY TAT Performed  Final  Resp Panel by RT PCR (RSV, Flu A&B, Covid) - Nasopharyngeal Swab     Status: Abnormal   Collection Time: 08/27/20  6:46 AM   Specimen: Nasopharyngeal Swab  Result Value Ref Range Status   SARS Coronavirus 2 by RT PCR NEGATIVE NEGATIVE Final    Comment: (NOTE) SARS-CoV-2 target nucleic acids are NOT DETECTED.  The SARS-CoV-2 RNA is generally detectable in upper respiratoy specimens during the acute phase of infection. The lowest concentration of SARS-CoV-2 viral copies this assay can detect is 131 copies/mL. A negative result does not preclude SARS-Cov-2 infection and should not be used as the sole  basis for treatment or other patient management decisions. A negative result Geathers occur with  improper specimen collection/handling, submission of specimen other than nasopharyngeal swab, presence of viral mutation(s) within the areas targeted by this assay, and inadequate number of viral copies (<131 copies/mL). A negative result must be combined with clinical observations, patient history, and epidemiological information. The expected result is Negative.  Fact Sheet for Patients:  https://www.moore.com/  Fact Sheet for Healthcare Providers:  https://www.young.biz/  This test is no t yet approved or cleared by the [place] FDA and  has been authorized for detection and/or diagnosis of SARS-CoV-2 by FDA under an Emergency Use Authorization (EUA). This EUA will remain  in effect (meaning this test can be used) for the duration of the COVID-19 declaration under Section 564(b)(1) of the Act, 21 U.S.C. section 360bbb-3(b)(1), unless the authorization  is terminated or revoked sooner.     Influenza A by PCR NEGATIVE NEGATIVE Final   Influenza B by PCR NEGATIVE NEGATIVE Final    Comment: (NOTE) The Xpert Xpress SARS-CoV-2/FLU/RSV assay is intended as an aid in  the diagnosis of influenza from Nasopharyngeal swab specimens and  should not be used as a sole basis for treatment. Nasal washings and  aspirates are unacceptable for Xpert Xpress SARS-CoV-2/FLU/RSV  testing.  Fact Sheet for Patients: https://www.moore.com/  Fact Sheet for Healthcare Providers: https://www.young.biz/  This test is not yet approved or cleared by the [place] FDA and  has been authorized for detection and/or diagnosis of SARS-CoV-2 by  FDA under an Emergency Use Authorization (EUA). This EUA will remain  in effect (meaning this test can be used) for the duration of the  Covid-19 declaration under Section 564(b)(1) of the  Act, 21  U.S.C. section 360bbb-3(b)(1), unless the authorization is  terminated or revoked.    Respiratory Syncytial Virus by PCR POSITIVE (A) NEGATIVE Final    Comment: (NOTE) Fact Sheet for Patients: https://www.moore.com/  Fact Sheet for Healthcare Providers: https://www.young.biz/  This test is not yet approved or cleared by the [place] FDA and  has been authorized for detection and/or diagnosis of SARS-CoV-2 by  FDA under an Emergency Use Authorization (EUA). This EUA will remain  in effect (meaning this test can be used) for the duration of the  COVID-19 declaration under Section 564(b)(1) of the Act, 21 U.S.C.  section 360bbb-3(b)(1), unless the authorization is terminated or  revoked. Performed at [facility], [address] [place], [place] [address]   Culture, blood (routine x 2) Call MD if unable to obtain prior to antibiotics being given     Status: None (Preliminary result)   Collection Time: 08/27/20  9:26 PM   Specimen: BLOOD  Result Value Ref Range Status   Specimen Description BLOOD LEFT [facility]  Final   Special Requests   Final    BOTTLES DRAWN AEROBIC AND ANAEROBIC Blood Culture adequate volume   Culture   Final    NO GROWTH 3 DAYS Performed at [facility], [address] [place], [place] [address]    Report Status PENDING  Incomplete  Culture, blood (routine x 2) Call MD if unable to obtain prior to antibiotics being given     Status: None (Preliminary result)   Collection Time: 08/27/20  9:26 PM   Specimen: BLOOD LEFT HAND  Result Value Ref Range Status   Specimen Description BLOOD LEFT HAND  Final   Special Requests   Final    BOTTLES DRAWN AEROBIC AND ANAEROBIC Blood Culture adequate volume   Culture   Final    NO GROWTH 3 DAYS Performed at [facility], [address], [place], [place] [address]    Report Status PENDING  Incomplete  Culture, sputum-assessment     Status: None    Collection Time: 08/28/20  5:30 AM   Specimen: Sputum  Result Value Ref Range Status   Specimen Description SPUTUM  Final   Special Requests NONE  Final   Sputum evaluation   Final    Sputum specimen not acceptable for testing.  Please recollect.   CALLED TO STEPHANIE RUDD AT 0645 ON 08/28/2020 [facility]. Performed at [facility], [address] [place], [place] [address]    Report Status 08/28/2020 FINAL  Final         Radiology Studies: No results found.  Scheduled Meds: . enoxaparin (LOVENOX) injection  40 mg Subcutaneous Q12H  . ipratropium-albuterol  3 mL Nebulization Q6H  . loratadine  10 mg Oral Daily  . methylPREDNISolone (SOLU-MEDROL) injection  40 mg Intravenous Q12H   Continuous Infusions: . sodium chloride 75 mL/hr at 08/30/20 0400    Assessment & Plan:   Principal Problem:   Acute bronchitis and bronchiolitis Active Problems:   Severe sepsis with septic shock (HCC)   RSV (acute bronchiolitis due to respiratory syncytial virus)   Severe sepsisand acute bronchitis and bronchiolitisdue to RSV, improving Shock physiology resolved Patient meet criteria for sepsis with tachycardia with heart rate of 110, tachypnea with RR 22.Lactic acid6.4Patient has leukocytosis, was starteddoxycycline, then d/c'd 9/29:still quite symptomatic /DOE.  RSV+ Sx >5 days, thus not started on Ribavarin. Lactic acid elevated on admission Ck lactic acid ivf for hydration Continue iv steroids Consulted PCCM- spoke to Dr. Meredeth IdeFleming about pt's status, he will see. Continue duonebs Continue IS. Ck bnp Should get sleep study as outpt   History of PCOS No acute issues  Morbid obesity BMI 53.25 Overall complicates medical care.   DVT prophylaxis: lovenox Code Status:full Family Communication: none at bedside  Status is: Inpatient  Remains inpatient appropriate because:Inpatient level of care appropriate due to severity of illness   Dispo: The  patient is from: Home              Anticipated d/c is to: Home              Anticipated d/c date is: 2 days              Patient currently is not medically stable to d/c.still quite sob on oxygen.PCCM consulted.            LOS: 2 days   Time spent: 45 min with >50% on coc   Lynn ItoSahar Para Cossey, MD [facility] Pager [phone]  If 7PM-7AM, please contact night-coverage [email or web address] Password [email or web address] 08/30/2020, 2:53 PM

## 2020-08-31 ENCOUNTER — Inpatient Hospital Stay (HOSPITAL_COMMUNITY)
Admit: 2020-08-31 | Discharge: 2020-08-31 | Disposition: A | Discharge status: Home / Self Care | Payer: BC Managed Care – PPO | Attending: Internal Medicine | Admitting: Internal Medicine

## 2020-08-31 DIAGNOSIS — R9431 Abnormal electrocardiogram [ECG] [EKG]: Secondary | ICD-10-CM

## 2020-08-31 DIAGNOSIS — I340 Nonrheumatic mitral (valve) insufficiency: Secondary | ICD-10-CM

## 2020-08-31 LAB — CBC WITH DIFFERENTIAL/PLATELET
Abs Immature Granulocytes: 0.93 10*3/uL — ABNORMAL HIGH (ref 0.00–0.07)
Basophils Absolute: 0.1 10*3/uL (ref 0.0–0.1)
Basophils Relative: 0 %
Eosinophils Absolute: 0 10*3/uL (ref 0.0–0.5)
Eosinophils Relative: 0 %
HCT: 36.3 % (ref 36.0–46.0)
Hemoglobin: 13 g/dL (ref 12.0–15.0)
Immature Granulocytes: 5 %
Lymphocytes Relative: 16 %
Lymphs Abs: 3.2 10*3/uL (ref 0.7–4.0)
MCH: 29.6 pg (ref 26.0–34.0)
MCHC: 35.8 g/dL (ref 30.0–36.0)
MCV: 82.7 fL (ref 80.0–100.0)
Monocytes Absolute: 1 10*3/uL (ref 0.1–1.0)
Monocytes Relative: 5 %
Neutro Abs: 14.2 10*3/uL — ABNORMAL HIGH (ref 1.7–7.7)
Neutrophils Relative %: 74 %
Platelets: 267 10*3/uL (ref 150–400)
RBC: 4.39 MIL/uL (ref 3.87–5.11)
RDW: 14.6 % (ref 11.5–15.5)
WBC: 19.3 10*3/uL — ABNORMAL HIGH (ref 4.0–10.5)
nRBC: 0.3 % — ABNORMAL HIGH (ref 0.0–0.2)

## 2020-08-31 LAB — T4, FREE: Free T4: 0.73 ng/dL (ref 0.61–1.12)

## 2020-08-31 LAB — BASIC METABOLIC PANEL
Anion gap: 11 (ref 5–15)
BUN: 16 mg/dL (ref 6–20)
CO2: 25 mmol/L (ref 22–32)
Calcium: 8.4 mg/dL — ABNORMAL LOW (ref 8.9–10.3)
Chloride: 103 mmol/L (ref 98–111)
Creatinine, Ser: 0.89 mg/dL (ref 0.44–1.00)
GFR calc Af Amer: >60 mL/min (ref >60)
GFR calc non Af Amer: >60 mL/min (ref >60)
Glucose, Bld: 136 mg/dL — ABNORMAL HIGH (ref 70–99)
Potassium: 3.7 mmol/L (ref 3.5–5.1)
Sodium: 139 mmol/L (ref 135–145)

## 2020-08-31 LAB — LACTIC ACID, PLASMA: Lactic Acid, Venous: 4.5 mmol/L (ref 0.5–1.9)

## 2020-08-31 MED ORDER — FUROSEMIDE 10 MG/ML IJ SOLN
20.0000 mg | Freq: Once | INTRAMUSCULAR | Status: AC
  Administered 2020-08-31: 20 mg via INTRAVENOUS
  Filled 2020-08-31: qty 4

## 2020-08-31 MED ORDER — PREDNISONE 20 MG PO TABS
40.0000 mg | ORAL_TABLET | Freq: Every day | ORAL | Status: DC
  Administered 2020-09-01: 40 mg via ORAL
  Filled 2020-08-31: qty 2

## 2020-08-31 NOTE — Progress Notes (Signed)
*  PRELIMINARY RESULTS* Echocardiogram 2D Echocardiogram has been performed.  Christine Valencia Christine Valencia 08/31/2020, 2:03 PM

## 2020-08-31 NOTE — Progress Notes (Signed)
PROGRESS NOTE    Christine Valencia  OEH:[id number] DOB: November 30, 1991 DOA: 08/27/2020 PCP: Patient, No Pcp Per    Brief Narrative:  29 year old female history significant for PCOS, morbid obesity who presents with shortness of breath.  Found to be RSV positive.  Covid negative.  Initially treated with Z-Pak for possible ear and sinus infection.  Worsening shortness of breath on the day prior to presentation.  Chest tightness.  No chest pain.     Consultants:   PCCM  Procedures:   Antimicrobials:   doxycycline -d/c'd   Subjective: Still with DOE. Tachycardia improved. No other complaints.  Objective: Vitals:   08/30/20 2016 08/31/20 0221 08/31/20 0259 08/31/20 0802  BP:  (!) 140/94    Pulse: 93 74    Resp: 18 17    Temp:  98.4 F (36.9 C)    TempSrc:  Oral    SpO2: 98% 95% 98% 94%  Weight:      Height:        Intake/Output Summary (Last 24 hours) at 08/31/2020 0814 Last data filed at 08/30/2020 1500 Gross per 24 hour  Intake 1185.05 ml  Output --  Net 1185.05 ml   Filed Weights   08/26/20 2250  Weight: (!) 154.2 kg    Examination: Comfortable, NAD, husband at bedside Minimal scattered crackles bilaterally no wheezing Regular S1-S2 no murmurs rubs gallops Soft benign obese nontender positive bowel sounds Trace pedal edema bilaterally Alert oriented x3, mood and affect appropriate for current setting    Data Reviewed: I have personally reviewed following labs and imaging studies  CBC: Recent Labs  Lab 08/26/20 2254 08/28/20 0426 08/29/20 0329 08/30/20 0522 08/31/20 0558  WBC 11.2* 19.4* 23.0* 21.1* 19.3*  NEUTROABS  --   --  19.9* 17.0* 14.2*  HGB 14.6 12.8 12.6 12.7 13.0  HCT 42.9 37.7 37.8 36.3 36.3  MCV 86.3 86.3 87.7 84.0 82.7  PLT 304 279 286 282 267   Basic Metabolic Panel: Recent Labs  Lab 08/26/20 2254 08/28/20 0426 08/29/20 0329 08/30/20 0522 08/31/20 0558  NA 138 138 138 137 139  K 3.7 4.2 4.0 4.5 3.7  CL 102 109 108 104 103  CO2 26  18* 21* 23 25  GLUCOSE 90 138* 133* 124* 136*  BUN 17 12 11 13 16   CREATININE 1.06* 0.90 0.78 0.92 0.89  CALCIUM 9.0 8.6* 8.6* 8.5* 8.4*   GFR: Estimated Creatinine Clearance: 147.8 mL/min (by C-G formula based on SCr of 0.89 mg/dL). Liver Function Tests: Recent Labs  Lab 08/28/20 0426  AST 21  ALT 17  ALKPHOS 63  BILITOT 0.6  PROT 7.3  ALBUMIN 3.6   No results for input(s): LIPASE, AMYLASE in the last 168 hours. No results for input(s): AMMONIA in the last 168 hours. Coagulation Profile: No results for input(s): INR, PROTIME in the last 168 hours. Cardiac Enzymes: No results for input(s): CKTOTAL, CKMB, CKMBINDEX, TROPONINI in the last 168 hours. BNP (last 3 results) No results for input(s): PROBNP in the last 8760 hours. HbA1C: No results for input(s): HGBA1C in the last 72 hours. CBG: No results for input(s): GLUCAP in the last 168 hours. Lipid Profile: No results for input(s): CHOL, HDL, LDLCALC, TRIG, CHOLHDL, LDLDIRECT in the last 72 hours. Thyroid Function Tests: Recent Labs    08/30/20 1815  TSH 0.336*   Anemia Panel: No results for input(s): VITAMINB12, FOLATE, FERRITIN, TIBC, IRON, RETICCTPCT in the last 72 hours. Sepsis Labs: Recent Labs  Lab 08/27/20 1146 08/27/20 1540 08/28/20 08/30/20  08/28/20 1215 08/30/20 1512 08/30/20 1815  PROCALCITON <0.10  --   --   --   --   --   LATICACIDVEN 6.4*   < > 3.3* 5.3* 3.3* 3.3*   < > = values in this interval not displayed.    Recent Results (from the past 240 hour(s))  Novel Coronavirus, NAA ([facility])     Status: None   Collection Time: 08/22/20  4:00 PM   Specimen: Nasal Swab; Nasopharyngeal(NP) swabs in vial transport medium   Nasopharynge  Patient  Result Value Ref Range Status   SARS-CoV-2, NAA Not Detected Not Detected Final    Comment: This nucleic acid amplification test was developed and its performance characteristics determined by [employer]. Nucleic acid amplification tests include  RT-PCR and TMA. This test has not been FDA cleared or approved. This test has been authorized by FDA under an Emergency Use Authorization (EUA). This test is only authorized for the duration of time the declaration that circumstances exist justifying the authorization of the emergency use of in vitro diagnostic tests for detection of SARS-CoV-2 virus and/or diagnosis of COVID-19 infection under section 564(b)(1) of the Act, 21 U.S.C. 081KGY-1(E) (1), unless the authorization is terminated or revoked sooner. When diagnostic testing is negative, the possibility of a false negative result should be considered in the context of a patient's recent exposures and the presence of clinical signs and symptoms consistent with COVID-19. An individual without symptoms of COVID-19 and who is not shedding SARS-CoV-2 virus wo uld expect to have a negative (not detected) result in this assay.   SARS-COV-2, NAA 2 DAY TAT     Status: None   Collection Time: 08/22/20  4:00 PM   Nasopharynge  Patient  Result Value Ref Range Status   SARS-CoV-2, NAA 2 DAY TAT Performed  Final  Resp Panel by RT PCR (RSV, Flu A&B, Covid) - Nasopharyngeal Swab     Status: Abnormal   Collection Time: 08/27/20  6:46 AM   Specimen: Nasopharyngeal Swab  Result Value Ref Range Status   SARS Coronavirus 2 by RT PCR NEGATIVE NEGATIVE Final    Comment: (NOTE) SARS-CoV-2 target nucleic acids are NOT DETECTED.  The SARS-CoV-2 RNA is generally detectable in upper respiratoy specimens during the acute phase of infection. The lowest concentration of SARS-CoV-2 viral copies this assay can detect is 131 copies/mL. A negative result does not preclude SARS-Cov-2 infection and should not be used as the sole basis for treatment or other patient management decisions. A negative result Cohill occur with  improper specimen collection/handling, submission of specimen other than nasopharyngeal swab, presence of viral mutation(s) within the areas  targeted by this assay, and inadequate number of viral copies (<131 copies/mL). A negative result must be combined with clinical observations, patient history, and epidemiological information. The expected result is Negative.  Fact Sheet for Patients:  https://www.moore.com/  Fact Sheet for Healthcare Providers:  https://www.young.biz/  This test is no t yet approved or cleared by the [place] FDA and  has been authorized for detection and/or diagnosis of SARS-CoV-2 by FDA under an Emergency Use Authorization (EUA). This EUA will remain  in effect (meaning this test can be used) for the duration of the COVID-19 declaration under Section 564(b)(1) of the Act, 21 U.S.C. section 360bbb-3(b)(1), unless the authorization is terminated or revoked sooner.     Influenza A by PCR NEGATIVE NEGATIVE Final   Influenza B by PCR NEGATIVE NEGATIVE Final    Comment: (NOTE) The Xpert Xpress  SARS-CoV-2/FLU/RSV assay is intended as an aid in  the diagnosis of influenza from Nasopharyngeal swab specimens and  should not be used as a sole basis for treatment. Nasal washings and  aspirates are unacceptable for Xpert Xpress SARS-CoV-2/FLU/RSV  testing.  Fact Sheet for Patients: https://www.moore.com/https://www.fda.gov/media/142436/download  Fact Sheet for Healthcare Providers: https://www.young.biz/https://www.fda.gov/media/142435/download  This test is not yet approved or cleared by the [place] FDA and  has been authorized for detection and/or diagnosis of SARS-CoV-2 by  FDA under an Emergency Use Authorization (EUA). This EUA will remain  in effect (meaning this test can be used) for the duration of the  Covid-19 declaration under Section 564(b)(1) of the Act, 21  U.S.C. section 360bbb-3(b)(1), unless the authorization is  terminated or revoked.    Respiratory Syncytial Virus by PCR POSITIVE (A) NEGATIVE Final    Comment: (NOTE) Fact Sheet for  Patients: https://www.moore.com/https://www.fda.gov/media/142436/download  Fact Sheet for Healthcare Providers: https://www.young.biz/https://www.fda.gov/media/142435/download  This test is not yet approved or cleared by the [place] FDA and  has been authorized for detection and/or diagnosis of SARS-CoV-2 by  FDA under an Emergency Use Authorization (EUA). This EUA will remain  in effect (meaning this test can be used) for the duration of the  COVID-19 declaration under Section 564(b)(1) of the Act, 21 U.S.C.  section 360bbb-3(b)(1), unless the authorization is terminated or  revoked. Performed at [facility], [address] [place], [place] [address]   Culture, blood (routine x 2) Call MD if unable to obtain prior to antibiotics being given     Status: None (Preliminary result)   Collection Time: 08/27/20  9:26 PM   Specimen: BLOOD  Result Value Ref Range Status   Spec[facility]  Final   Special Requests   Final    BOTTLES DRAWN AEROBIC AND ANAEROBIC Blood Culture adequate volume   Culture   Final    NO GROWTH 4 DAYS Performed at [facility], [address] [place], [place] [address]    Report Status PENDING  Incomplete  Culture, blood (routine x 2) Call MD if unable to obtain prior to antibiotics being given     Status: None (Preliminary result)   Collection Time: 08/27/20  9:26 PM   Specimen: BLOOD LEFT HAND  Result Value Ref Range Status   Specimen Description BLOOD LEFT HAND  Final   Special Requests   Final    BOTTLES DRAWN AEROBIC AND ANAEROBIC Blood Culture adequate volume   Culture   Final    NO GROWTH 4 DAYS Performed at [facility], [address] [place], [place] [address]    Report Status PENDING  Incomplete  Culture, sputum-assessment     Status: None   Collection Time: 08/28/20  5:30 AM   Specimen: Sputum  Result Value Ref Range Status   Specimen Description SPUTUM  Final   Special Requests NONE  Final   Sputum evaluation   Final     Sputum specimen not acceptable for testing.  Please recollect.   CALLED TO STEPHANIE RUDD AT 0645 ON 08/28/2020 [facility]. Performed at [facility], [address] [place], [place] [address]    Report Status 08/28/2020 FINAL  Final         Radiology Studies: No results found.      Scheduled Meds: . enoxaparin (LOVENOX) injection  40 mg Subcutaneous Q12H  . furosemide  20 mg Intravenous Once  . ipratropium-albuterol  3 mL Nebulization Q6H  . loratadine  10 mg  Oral Daily  . methylPREDNISolone (SOLU-MEDROL) injection  40 mg Intravenous Q12H   Continuous Infusions:   Assessment & Plan:   Principal Problem:   Acute bronchitis and bronchiolitis Active Problems:   Severe sepsis with septic shock (HCC)   RSV (acute bronchiolitis due to respiratory syncytial virus)   Severe sepsisand acute bronchitis and bronchiolitisdue to RSV, improving Shock physiology resolved Patient meet criteria for sepsis with tachycardia with heart rate of 110, tachypnea with RR 22.Lactic acid6.4Patient has leukocytosis, was starteddoxycycline, then d/c'd 9/29:still quite symptomatic /DOE.  RSV+ Sx >5 days, thus not started on Ribavarin. Lactic acid elevated on admission 9/3-- d/c IVF  Repeat lactic acid going up?  Fibrin nml, doubt pe pccm on board, will continue iv steroids Give lasix 20mg  iv bid today and reassess as pt bnp elevated . Ck echo Continue IS Needs sleep study as outpt.   History of PCOS No acute issues  Morbid obesity BMI 53.25 Overall complicates medical care Needs sleep study due to her body habitus to evaluate for osa   DVT prophylaxis: lovenox Code Status:full Family Communication: none at bedside  Status is: Inpatient  Remains inpatient appropriate because:Inpatient level of care appropriate due to severity of illness   Dispo: The patient is from: Home              Anticipated d/c is to: Home              Anticipated d/c date is: 1-2 dats               Patient currently is not medically stable to d/c.still quite DOE. Echo ordered. Giving iv lasix.            LOS: 3 days   Time spent: 35 min with >50% on coc   , MD [facility] Pager [phone]  If 7PM-7AM, please contact night-coverage [email or web address] Password [email or web address] 08/31/2020, 8:14 AM

## 2020-08-31 NOTE — Progress Notes (Signed)
Ch visited with Pt as part of routine rounding. Pt spoke at length about familial traumas. Pt is afraid about health prognosis because she lost an aunt around her same age to her same health condition. Pt is struggling with concerns about family, and health that keep her awake. Pt reports that her husband is a saint, and takes care of her well. Ch provided pastoral presence and support. Ch had to leave for a Code BLUE. CH will come back to pray with Pt.   11:17 a.m.  Ch attempted to follow-up on Pt. Pt asleep at this time. Ch will try again later.

## 2020-08-31 NOTE — Progress Notes (Signed)
Date: 08/31/2020,   MRN# [id number] Christine Valencia 1991/11/05 Code Status:     Code Status Orders  (From admission, onward)         Start     Ordered   08/27/20 1946  Full code  Continuous        08/27/20 1945        Code Status History    This patient has a current code status but no historical code status.   Advance Care Planning Activity        HPI: Uneventful night. Still dyspneic with activity. W/u in progress Echo pending  PMHX:   Past Medical History:  Diagnosis Date   PCOS (polycystic ovarian syndrome)    Surgical Hx:  Past Surgical History:  Procedure Laterality Date   BREAST BIOPSY     BREAST LUMPECTOMY  01/11/2011   KNEE SURGERY Right 2010   Family Hx:  Family History  Problem Relation Age of Onset   Breast cancer Mother    Melanoma Father    Social Hx:   Social History   Tobacco Use   Smoking status: Former Smoker   Smokeless tobacco: Never Used  Building services engineer Use: Never used  Substance Use Topics   Alcohol use: Yes    Comment: occasionally   Drug use: Never   Medication:    Home Medication:    Current Medication: @CURMEDTAB @   Allergies:  Blueberry [vaccinium angustifolium] and Selective estrogen receptor modulators  Review of Systems: Gen:  Denies  fever, sweats, chills HEENT: Denies blurred vision, double vision, ear pain, eye pain, hearing loss, nose bleeds, sore throat Cvc:  No dizziness, chest pain or heaviness Resp:   dyspnea on exertion Gi: Denies swallowing difficulty, stomach pain, nausea or vomiting, diarrhea, constipation, bowel incontinence Gu:  Denies bladder incontinence, burning urine Ext:   No Joint pain, stiffness or swelling Skin: No skin rash, easy bruising or bleeding or hives Endoc:  No polyuria, polydipsia , polyphagia or weight change Psych: No depression, insomnia or hallucinations  Other:  All other systems negative  Physical Examination:   VS: BP 137/82 (BP Location: Left Arm)    Pulse  (!) 102    Temp 97.9 F (36.6 C) (Oral)    Resp 18    Ht 5\' 7"  (1.702 m)    Wt (!) 154.2 kg    LMP 08/20/2020 (Exact Date)    SpO2 96%    BMI 53.25 kg/m   General Appearance: No distress over weight, short thick neck Neuro: without focal findings, mental status, speech normal, alert and oriented, cranial nerves 2-12 intact, reflexes normal and symmetric, sensation grossly normal  HEENT: PERRLA, EOM intact, no ptosis, no other lesions noticed, Pulmonary:.No wheezing, No rales  Sputum Production:   Cardiovascular:  Normal S1,S2.  No m/r/g.     Abdomen:Benign, Soft, non-tender, No masses, hepatosplenomegaly, No lymphadenopathy Endoc: No evident thyromegaly, no signs of acromegaly or Cushing features Skin:   warm, no rashes, no ecchymosis  Extremities: normal, no cyanosis, clubbing, no edema, warm with normal capillary refill. Other findings:   Labs results:   Recent Labs    08/29/20 0329 08/30/20 0522 08/31/20 0558  HGB 12.6 12.7 13.0  HCT 37.8 36.3 36.3  MCV 87.7 84.0 82.7  WBC 23.0* 21.1* 19.3*  BUN 11 13 16   CREATININE 0.78 0.92 0.89  GLUCOSE 133* 124* 136*  CALCIUM 8.6* 8.5* 8.4*  ,  D-dimer 132  tsh 0.33, t4 0.73 (nl),   Lactic acid  4.5  bnp 124    Assessment and Plan:  RSV bronchitis and bronchospasm. cxr clear, here presentation was c/w septic parameters. WBC is up ( down a tad), most likely due to the steroids. . Suspect underlying  Low grade reactive airway disease treating as suc, doubt pe. Lingering lactic acidosis.  -will stay the course as out lined -suppress cough --dvt prophylaxis -incentive spirometry -physical tx consult  Her habitus put her at risk for obstructive sleep apnea -out patient sleep study   I have personally obtained a history, examined the patient, evaluated laboratory and imaging results, formulated the assessment and plan and placed orders.  The Patient requires high complexity decision making for assessment and support, frequent  evaluation and titration of therapies, application of advanced monitoring technologies and extensive interpretation of multiple databases.   Jerrik Housholder,M.D. Pulmonary & Critical care Medicine [facility]

## 2020-09-01 LAB — CBC
HCT: 41.4 % (ref 36.0–46.0)
Hemoglobin: 14 g/dL (ref 12.0–15.0)
MCH: 28.7 pg (ref 26.0–34.0)
MCHC: 33.8 g/dL (ref 30.0–36.0)
MCV: 85 fL (ref 80.0–100.0)
Platelets: 305 10*3/uL (ref 150–400)
RBC: 4.87 MIL/uL (ref 3.87–5.11)
RDW: 14.6 % (ref 11.5–15.5)
WBC: 21.2 10*3/uL — ABNORMAL HIGH (ref 4.0–10.5)
nRBC: 0.5 % — ABNORMAL HIGH (ref 0.0–0.2)

## 2020-09-01 LAB — ECHOCARDIOGRAM COMPLETE
AR max vel: 3.5 cm2
AV Area VTI: 3.57 cm2
AV Area mean vel: 3.55 cm2
AV Mean grad: 3 mmHg
AV Peak grad: 7 mmHg
Ao pk vel: 1.32 m/s
Area-P 1/2: 4.57 cm2
Calc EF: 70.8 %
Single Plane A2C EF: 70 %
Single Plane A4C EF: 71.1 %

## 2020-09-01 LAB — CULTURE, BLOOD (ROUTINE X 2)
Culture: NO GROWTH
Culture: NO GROWTH
Special Requests: ADEQUATE
Special Requests: ADEQUATE

## 2020-09-01 LAB — LACTIC ACID, PLASMA: Lactic Acid, Venous: 3 mmol/L (ref 0.5–1.9)

## 2020-09-01 LAB — BASIC METABOLIC PANEL
Anion gap: 9 (ref 5–15)
BUN: 23 mg/dL — ABNORMAL HIGH (ref 6–20)
CO2: 27 mmol/L (ref 22–32)
Calcium: 8.5 mg/dL — ABNORMAL LOW (ref 8.9–10.3)
Chloride: 99 mmol/L (ref 98–111)
Creatinine, Ser: 0.96 mg/dL (ref 0.44–1.00)
GFR calc Af Amer: >60 mL/min (ref >60)
GFR calc non Af Amer: >60 mL/min (ref >60)
Glucose, Bld: 125 mg/dL — ABNORMAL HIGH (ref 70–99)
Potassium: 3.8 mmol/L (ref 3.5–5.1)
Sodium: 135 mmol/L (ref 135–145)

## 2020-09-01 MED ORDER — PREDNISONE 20 MG PO TABS
20.0000 mg | ORAL_TABLET | Freq: Every day | ORAL | 0 refills | Status: DC
Start: 2020-09-02 — End: 2020-11-01

## 2020-09-01 MED ORDER — IPRATROPIUM-ALBUTEROL 0.5-2.5 (3) MG/3ML IN SOLN: 3.0000 mL | Freq: Two times a day (BID) | RESPIRATORY_TRACT | Status: DC

## 2020-09-01 MED ORDER — ALBUTEROL SULFATE HFA 108 (90 BASE) MCG/ACT IN AERS: 2.0000 | INHALATION_SPRAY | Freq: Four times a day (QID) | RESPIRATORY_TRACT | 0 refills | Status: AC | PRN

## 2020-09-01 NOTE — Discharge Summary (Signed)
Christine Valencia ZOX:[id number] DOB: Jun 19, 1991 DOA: 08/27/2020  PCP: Patient, No Pcp Per  Admit date: 08/27/2020 Discharge date: 09/01/2020  Admitted From: home Disposition:  home  Recommendations for Outpatient Follow-up:  1. Follow up with PCP in 1 week 2. Please obtain BMP/CBC in one week 3. F/u with Dr. Meredeth Ide next week     Discharge Condition:Stable CODE STATUS:full  Diet recommendation: Heart Healthy  Brief/Interim Summary: Christine Valencia is a 29 y.o. female with medical history significant of PCOS, who presents with SOB.   Pt state she was seen in urgent care 5 days ago due to cough,  ear pain, congestion and postnasal drip.  Patient was tested negative for Covid.  Patient was treated with course of Z-Pak for possible ear and sinus infection, which she finished yesterday.  Patient states that she still has muffled sound on both ears.  She states that she developed shortness breath, which has been progressively worsening. She denies fever or chills.  She has chest tightness, but no chest pain.  Patient had nausea and vomited once yesterday.  Currently patient does not have nausea, vomiting, diarrhea or abdominal pain.  No symptoms of UTI or unilateral weakness. No history of asthma or COPD.  No history of CHF. Per ED physician, oxygen desaturation to lower 90s on ambulation in ED. pt was found to have RVP negative for Covid, but positive for RSV, negative D-dimer, WBC 11.2, troponin level 3, 3, electrolytes renal function okay, temperature normal, blood pressure 121/89, tachycardia with heart rate 110, RR 22, currently oxygen saturation 94% on room air.  Chest x-ray negative for infiltration.    Severe sepsisand acute bronchitis and bronchiolitisdue to RSV, improving Shock physiology resolved Patient meet criteria for sepsis with tachycardia with heart rate of 110, tachypnea with RR 22.Lactic acid6.4Patient has leukocytosis, was starteddoxycycline, then d/c'd 9/29:still quite  symptomatic /DOE.  RSV+ Sx >5 days, thus not started on Ribavarin. Lactic acid elevated on admission, now trending down Fibrin nml, doubt pe Iv steroid switched to po taper Given lasix since with fluid retention from steroids and ivf.  Echo nml EF.  PCCM ok with pt being d/c;d and f/u next week with Dr. Meredeth Ide   History of PCOS No acute issues  Morbid obesity BMI 53.25 Overall complicates medical care Needs sleep study due to her body habitus to evaluate for osa   Discharge Diagnoses:  Principal Problem:   Acute bronchitis and bronchiolitis Active Problems:   Severe sepsis with septic shock (HCC)   RSV (acute bronchiolitis due to respiratory syncytial virus)    Discharge Instructions  Discharge Instructions    Call MD for:  difficulty breathing, headache or visual disturbances   Complete by: As directed    Call MD for:  temperature >100.4   Complete by: As directed    Diet - low sodium heart healthy   Complete by: As directed    Discharge instructions   Complete by: As directed    Follow up with Dr. Meredeth Ide next Thursday. Please contact his office. Need to get sleep study Need to be setup with pcp   Increase activity slowly   Complete by: As directed      Allergies as of 09/01/2020      Reactions   Blueberry [vaccinium Angustifolium] Hives   Selective Estrogen Receptor Modulators Itching, Other (See Comments)   Reports "it feels like my body is on fire"      Medication List    STOP taking these medications   azithromycin  250 MG tablet Commonly known as: ZITHROMAX   guaiFENesin 600 MG 12 hr tablet Commonly known as: MUCINEX     TAKE these medications   albuterol 108 (90 Base) MCG/ACT inhaler Commonly known as: VENTOLIN HFA Inhale 2 puffs into the lungs every 6 (six) hours as needed for wheezing or shortness of breath.   loratadine 10 MG tablet Commonly known as: CLARITIN Take 10 mg by mouth daily.   predniSONE 20 MG tablet Commonly known as:  DELTASONE Take 1 tablet (20 mg total) by mouth daily with breakfast. Start taking on: September 02, 2020       Follow-up Information    Mertie Moores, MD In 1 week.   Specialty: Specialist Why: pt needs to call to schedule her appt and give them personal and insurance info Contact information: [address] [place] [address] [phone]              Allergies  Allergen Reactions  . Blueberry [Vaccinium Angustifolium] Hives  . Selective Estrogen Receptor Modulators Itching and Other (See Comments)    Reports "it feels like my body is on fire"    Consultations:  PCCM   Procedures/Studies: DG Chest 2 View  Result Date: 08/26/2020 CLINICAL DATA:  Diagnosed with sinus infection on Tuesday. Finished antibiotics today but now feeling short of breath. COVID negative. EXAM: CHEST - 2 VIEW COMPARISON:  None. FINDINGS: The heart size and mediastinal contours are within normal limits. Both lungs are clear. The visualized skeletal structures are unremarkable. IMPRESSION: No active cardiopulmonary disease. Electronically Signed   By: Burman Nieves M.D.   On: 08/26/2020 23:09   ECHOCARDIOGRAM COMPLETE  Result Date: 09/01/2020    ECHOCARDIOGRAM REPORT   Patient Name:   Christine Valencia Date of Exam: 08/31/2020 Medical Rec #:  [id number]  Height:       67.0 in Accession #:    [id number] Weight:       340.0 lb Date of Birth:  [birth year]   BSA:          2.534 m Patient Age:    29 years   BP:           137/82 mmHg Patient Gender: F          HR:           80 bpm. Exam Location:  [facility] Procedure: 2D Echo, Color Doppler and Cardiac Doppler Indications:     R94.31 Abnormal ECG  History:         Patient has no prior history of Echocardiogram examinations. No                  medical history.  Sonographer:     Humphrey Rolls RDCS (AE) Referring Phys:  [id number] [facility] Diagnosing Phys: Julien Nordmann MD  Sonographer Comments: Suboptimal parasternal window and no subcostal window. Image acquisition  challenging due to patient body habitus. IMPRESSIONS  1. Left ventricular ejection fraction, by estimation, is 60 to 65%. The left ventricle has normal function. The left ventricle has no regional wall motion abnormalities. Left ventricular diastolic parameters were normal.  2. Right ventricular systolic function is normal. The right ventricular size is normal. Tricuspid regurgitation signal is inadequate for assessing PA pressure. FINDINGS  Left Ventricle: Left ventricular ejection fraction, by estimation, is 60 to 65%. The left ventricle has normal function. The left ventricle has no regional wall motion abnormalities. The left ventricular internal cavity size was normal in size. There is  no left  ventricular hypertrophy. Left ventricular diastolic parameters were normal. Right Ventricle: The right ventricular size is normal. No increase in right ventricular wall thickness. Right ventricular systolic function is normal. Tricuspid regurgitation signal is inadequate for assessing PA pressure. Left Atrium: Left atrial size was normal in size. Right Atrium: Right atrial size was normal in size. Pericardium: There is no evidence of pericardial effusion. Mitral Valve: The mitral valve is normal in structure. Mild mitral valve regurgitation. No evidence of mitral valve stenosis. MV peak gradient, 3.5 mmHg. The mean mitral valve gradient is 2.0 mmHg. Tricuspid Valve: The tricuspid valve is normal in structure. Tricuspid valve regurgitation is not demonstrated. No evidence of tricuspid stenosis. Aortic Valve: The aortic valve is normal in structure. Aortic valve regurgitation is not visualized. No aortic stenosis is present. Aortic valve mean gradient measures 3.0 mmHg. Aortic valve peak gradient measures 7.0 mmHg. Aortic valve area, by VTI measures 3.57 cm. Pulmonic Valve: The pulmonic valve was normal in structure. Pulmonic valve regurgitation is not visualized. No evidence of pulmonic stenosis. Aorta: The aortic root  is normal in size and structure. Venous: The inferior vena cava is normal in size with greater than 50% respiratory variability, suggesting right atrial pressure of 3 mmHg. IAS/Shunts: No atrial level shunt detected by color flow Doppler.  LEFT VENTRICLE PLAX 2D LVOT diam:     2.40 cm      Diastology LV SV:         78           LV e' lateral:   7.72 cm/s LV SV Index:   31           LV E/e' lateral: 10.0 LVOT Area:     4.52 cm  LV Volumes (MOD) LV vol d, MOD A2C: 114.0 ml LV vol d, MOD A4C: 101.0 ml LV vol s, MOD A2C: 34.2 ml LV vol s, MOD A4C: 29.2 ml LV SV MOD A2C:     79.8 ml LV SV MOD A4C:     101.0 ml LV SV MOD BP:      77.0 ml LEFT ATRIUM             Index LA Vol (A2C):   41.7 ml 16.46 ml/m LA Vol (A4C):   49.7 ml 19.61 ml/m LA Biplane Vol: 47.1 ml 18.59 ml/m  AORTIC VALVE                   PULMONIC VALVE AV Area (Vmax):    3.50 cm    PV Vmax:       0.98 m/s AV Area (Vmean):   3.55 cm    PV Vmean:      69.200 cm/s AV Area (VTI):     3.57 cm    PV VTI:        0.174 m AV Vmax:           132.00 cm/s PV Peak grad:  3.8 mmHg AV Vmean:          84.200 cm/s PV Mean grad:  2.0 mmHg AV VTI:            0.218 m AV Peak Grad:      7.0 mmHg AV Mean Grad:      3.0 mmHg LVOT Vmax:         102.00 cm/s LVOT Vmean:        66.000 cm/s LVOT VTI:          0.172 m LVOT/AV VTI ratio: 0.79  AORTA Ao Root diam: 3.20 cm MITRAL VALVE MV Area (PHT): 4.57 cm    SHUNTS MV Peak grad:  3.5 mmHg    Systemic VTI:  0.17 m MV Mean grad:  2.0 mmHg    Systemic Diam: 2.40 cm MV Vmax:       0.93 m/s MV Vmean:      64.6 cm/s MV Decel Time: 166 msec MV E velocity: 77.10 cm/s MV A velocity: 61.70 cm/s MV E/A ratio:  1.25 Julien Nordmann MD Electronically signed by Julien Nordmann MD Signature Date/Time: 09/01/2020/10:19:55 AM    Final        Subjective: Feels much better than when she came in.   Discharge Exam: Vitals:   09/01/20 0757 09/01/20 0759  BP:  119/76  Pulse: 85 81  Resp: 18 18  Temp:  97.8 F (36.6 C)  SpO2: 95% 95%    Vitals:   08/31/20 2329 09/01/20 0224 09/01/20 0757 09/01/20 0759  BP: 122/81   119/76  Pulse: 91  85 81  Resp: 20  18 18   Temp: 98 F (36.7 C)   97.8 F (36.6 C)  TempSrc: Oral   Oral  SpO2: 95% 96% 95% 95%  Weight:      Height:        General: Pt is alert, awake, not in acute distress Cardiovascular: RRR, S1/S2 +, no rubs, no gallops Respiratory: CTA bilaterally, no wheezing, no rhonchi Abdominal: Soft, NT, ND, bowel sounds + Extremities: no edema, no cyanosis    The results of significant diagnostics from this hospitalization (including imaging, microbiology, ancillary and laboratory) are listed below for reference.     Microbiology: Recent Results (from the past 240 hour(s))  Novel Coronavirus, NAA ([facility])     Status: None   Collection Time: 08/22/20  4:00 PM   Specimen: Nasal Swab; Nasopharyngeal(NP) swabs in vial transport medium   Nasopharynge  Patient  Result Value Ref Range Status   SARS-CoV-2, NAA Not Detected Not Detected Final    Comment: This nucleic acid amplification test was developed and its performance characteristics determined by 08/24/20. Nucleic acid amplification tests include RT-PCR and TMA. This test has not been FDA cleared or approved. This test has been authorized by FDA under an Emergency Use Authorization (EUA). This test is only authorized for the duration of time the declaration that circumstances exist justifying the authorization of the emergency use of in vitro diagnostic tests for detection of SARS-CoV-2 virus and/or diagnosis of COVID-19 infection under section 564(b)(1) of the Act, 21 U.S.C. [employer]) (1), unless the authorization is terminated or revoked sooner. When diagnostic testing is negative, the possibility of a false negative result should be considered in the context of a patient's recent exposures and the presence of clinical signs and symptoms consistent with COVID-19. An individual without symptoms  of COVID-19 and who is not shedding SARS-CoV-2 virus wo uld expect to have a negative (not detected) result in this assay.   SARS-COV-2, NAA 2 DAY TAT     Status: None   Collection Time: 08/22/20  4:00 PM   Nasopharynge  Patient  Result Value Ref Range Status   SARS-CoV-2, NAA 2 DAY TAT Performed  Final  Resp Panel by RT PCR (RSV, Flu A&B, Covid) - Nasopharyngeal Swab     Status: Abnormal   Collection Time: 08/27/20  6:46 AM   Specimen: Nasopharyngeal Swab  Result Value Ref Range Status   SARS Coronavirus 2 by RT PCR NEGATIVE NEGATIVE Final  Comment: (NOTE) SARS-CoV-2 target nucleic acids are NOT DETECTED.  The SARS-CoV-2 RNA is generally detectable in upper respiratoy specimens during the acute phase of infection. The lowest concentration of SARS-CoV-2 viral copies this assay can detect is 131 copies/mL. A negative result does not preclude SARS-Cov-2 infection and should not be used as the sole basis for treatment or other patient management decisions. A negative result [place] occur with  improper specimen collection/handling, submission of specimen other than nasopharyngeal swab, presence of viral mutation(s) within the areas targeted by this assay, and inadequate number of viral copies (<131 copies/mL). A negative result must be combined with clinical observations, patient history, and epidemiological information. The expected result is Negative.  Fact Sheet for Patients:  https://www.moore.com/  Fact Sheet for Healthcare Providers:  https://www.young.biz/  This test is no t yet approved or cleared by the [place] FDA and  has been authorized for detection and/or diagnosis of SARS-CoV-2 by FDA under an Emergency Use Authorization (EUA). This EUA will remain  in effect (meaning this test can be used) for the duration of the COVID-19 declaration under Section 564(b)(1) of the Act, 21 U.S.C. section 360bbb-3(b)(1), unless the  authorization is terminated or revoked sooner.     Influenza A by PCR NEGATIVE NEGATIVE Final   Influenza B by PCR NEGATIVE NEGATIVE Final    Comment: (NOTE) The Xpert Xpress SARS-CoV-2/FLU/RSV assay is intended as an aid in  the diagnosis of influenza from Nasopharyngeal swab specimens and  should not be used as a sole basis for treatment. Nasal washings and  aspirates are unacceptable for Xpert Xpress SARS-CoV-2/FLU/RSV  testing.  Fact Sheet for Patients: https://www.moore.com/  Fact Sheet for Healthcare Providers: https://www.young.biz/  This test is not yet approved or cleared by the [place] FDA and  has been authorized for detection and/or diagnosis of SARS-CoV-2 by  FDA under an Emergency Use Authorization (EUA). This EUA will remain  in effect (meaning this test can be used) for the duration of the  Covid-19 declaration under Section 564(b)(1) of the Act, 21  U.S.C. section 360bbb-3(b)(1), unless the authorization is  terminated or revoked.    Respiratory Syncytial Virus by PCR POSITIVE (A) NEGATIVE Final    Comment: (NOTE) Fact Sheet for Patients: https://www.moore.com/  Fact Sheet for Healthcare Providers: https://www.young.biz/  This test is not yet approved or cleared by the [place] FDA and  has been authorized for detection and/or diagnosis of SARS-CoV-2 by  FDA under an Emergency Use Authorization (EUA). This EUA will remain  in effect (meaning this test can be used) for the duration of the  COVID-19 declaration under Section 564(b)(1) of the Act, 21 U.S.C.  section 360bbb-3(b)(1), unless the authorization is terminated or  revoked. Performed at [facility], [address] [place], [place] [address]   Culture, blood (routine x 2) Call MD if unable to obtain prior to antibiotics being given     Status: None   Collection Time: 08/27/20  9:26 PM   Specimen:  BLOOD  Result Value Ref Range Status   Specimen Description BLOOD LEFT [facility]  Final   Special Requests   Final    BOTTLES DRAWN AEROBIC AND ANAEROBIC Blood Culture adequate volume   Culture   Final    NO GROWTH 5 DAYS Performed at [facility], [address] [place], [place] [address]    Report Status 09/01/2020 FINAL  Final  Culture, blood (routine x 2) Call MD if unable to obtain prior to antibiotics being  given     Status: None   Collection Time: 08/27/20  9:26 PM   Specimen: BLOOD LEFT HAND  Result Value Ref Range Status   Specimen Description BLOOD LEFT HAND  Final   Special Requests   Final    BOTTLES DRAWN AEROBIC AND ANAEROBIC Blood Culture adequate volume   Culture   Final    NO GROWTH 5 DAYS Performed at [facility], [address], [place], [place] [address]    Report Status 09/01/2020 FINAL  Final  Culture, sputum-assessment     Status: None   Collection Time: 08/28/20  5:30 AM   Specimen: Sputum  Result Value Ref Range Status   Specimen Description SPUTUM  Final   Special Requests NONE  Final   Sputum evaluation   Final    Sputum specimen not acceptable for testing.  Please recollect.   CALLED TO STEPHANIE RUDD AT 0645 ON 08/28/2020 [facility]. Performed at [facility], [address] [place], [place] [address]    Report Status 08/28/2020 FINAL  Final     Labs: BNP (last 3 results) Recent Labs    08/27/20 1540 08/30/20 1815  BNP 24.5 124.4*   Basic Metabolic Panel: Recent Labs  Lab 08/28/20 0426 08/29/20 0329 08/30/20 0522 08/31/20 0558 09/01/20 0531  NA 138 138 137 139 135  K 4.2 4.0 4.5 3.7 3.8  CL 109 108 104 103 99  CO2 18* 21* 23 25 27   GLUCOSE 138* 133* 124* 136* 125*  BUN 12 11 13 16  23*  CREATININE 0.90 0.78 0.92 0.89 0.96  CALCIUM 8.6* 8.6* 8.5* 8.4* 8.5*   Liver Function Tests: Recent Labs  Lab 08/28/20 0426  AST 21  ALT 17  ALKPHOS 63  BILITOT 0.6  PROT 7.3  ALBUMIN 3.6   No results for input(s):  LIPASE, AMYLASE in the last 168 hours. No results for input(s): AMMONIA in the last 168 hours. CBC: Recent Labs  Lab 08/28/20 0426 08/29/20 0329 08/30/20 0522 08/31/20 0558 09/01/20 0825  WBC 19.4* 23.0* 21.1* 19.3* 21.2*  NEUTROABS  --  19.9* 17.0* 14.2*  --   HGB 12.8 12.6 12.7 13.0 14.0  HCT 37.7 37.8 36.3 36.3 41.4  MCV 86.3 87.7 84.0 82.7 85.0  PLT 279 286 282 267 305   Cardiac Enzymes: No results for input(s): CKTOTAL, CKMB, CKMBINDEX, TROPONINI in the last 168 hours. BNP: Invalid input(s): POCBNP CBG: No results for input(s): GLUCAP in the last 168 hours. D-Dimer No results for input(s): DDIMER in the last 72 hours. Hgb A1c No results for input(s): HGBA1C in the last 72 hours. Lipid Profile No results for input(s): CHOL, HDL, LDLCALC, TRIG, CHOLHDL, LDLDIRECT in the last 72 hours. Thyroid function studies Recent Labs    08/30/20 1815  TSH 0.336*   Anemia work up No results for input(s): VITAMINB12, FOLATE, FERRITIN, TIBC, IRON, RETICCTPCT in the last 72 hours. Urinalysis No results found for: COLORURINE, APPEARANCEUR, LABSPEC, PHURINE, GLUCOSEU, HGBUR, BILIRUBINUR, KETONESUR, PROTEINUR, UROBILINOGEN, NITRITE, LEUKOCYTESUR Sepsis Labs Invalid input(s): PROCALCITONIN,  WBC,  LACTICIDVEN Microbiology Recent Results (from the past 240 hour(s))  Novel Coronavirus, NAA ([facility])     Status: None   Collection Time: 08/22/20  4:00 PM   Specimen: Nasal Swab; Nasopharyngeal(NP) swabs in vial transport medium   Nasopharynge  Patient  Result Value Ref Range Status   SARS-CoV-2, NAA Not Detected Not Detected Final    Comment: This nucleic acid amplification test was developed and its performance characteristics determined by 09/01/20.  Nucleic acid amplification tests include RT-PCR and TMA. This test has not been FDA cleared or approved. This test has been authorized by FDA under an Emergency Use Authorization (EUA). This test is only authorized for the  duration of time the declaration that circumstances exist justifying the authorization of the emergency use of in vitro diagnostic tests for detection of SARS-CoV-2 virus and/or diagnosis of COVID-19 infection under section 564(b)(1) of the Act, 21 U.S.C. 629BMW-4(X) (1), unless the authorization is terminated or revoked sooner. When diagnostic testing is negative, the possibility of a false negative result should be considered in the context of a patient's recent exposures and the presence of clinical signs and symptoms consistent with COVID-19. An individual without symptoms of COVID-19 and who is not shedding SARS-CoV-2 virus wo uld expect to have a negative (not detected) result in this assay.   SARS-COV-2, NAA 2 DAY TAT     Status: None   Collection Time: 08/22/20  4:00 PM   Nasopharynge  Patient  Result Value Ref Range Status   SARS-CoV-2, NAA 2 DAY TAT Performed  Final  Resp Panel by RT PCR (RSV, Flu A&B, Covid) - Nasopharyngeal Swab     Status: Abnormal   Collection Time: 08/27/20  6:46 AM   Specimen: Nasopharyngeal Swab  Result Value Ref Range Status   SARS Coronavirus 2 by RT PCR NEGATIVE NEGATIVE Final    Comment: (NOTE) SARS-CoV-2 target nucleic acids are NOT DETECTED.  The SARS-CoV-2 RNA is generally detectable in upper respiratoy specimens during the acute phase of infection. The lowest concentration of SARS-CoV-2 viral copies this assay can detect is 131 copies/mL. A negative result does not preclude SARS-Cov-2 infection and should not be used as the sole basis for treatment or other patient management decisions. A negative result [place] occur with  improper specimen collection/handling, submission of specimen other than nasopharyngeal swab, presence of viral mutation(s) within the areas targeted by this assay, and inadequate number of viral copies (<131 copies/mL). A negative result must be combined with clinical observations, patient history, and epidemiological  information. The expected result is Negative.  Fact Sheet for Patients:  https://www.moore.com/  Fact Sheet for Healthcare Providers:  https://www.young.biz/  This test is no t yet approved or cleared by the [place] FDA and  has been authorized for detection and/or diagnosis of SARS-CoV-2 by FDA under an Emergency Use Authorization (EUA). This EUA will remain  in effect (meaning this test can be used) for the duration of the COVID-19 declaration under Section 564(b)(1) of the Act, 21 U.S.C. section 360bbb-3(b)(1), unless the authorization is terminated or revoked sooner.     Influenza A by PCR NEGATIVE NEGATIVE Final   Influenza B by PCR NEGATIVE NEGATIVE Final    Comment: (NOTE) The Xpert Xpress SARS-CoV-2/FLU/RSV assay is intended as an aid in  the diagnosis of influenza from Nasopharyngeal swab specimens and  should not be used as a sole basis for treatment. Nasal washings and  aspirates are unacceptable for Xpert Xpress SARS-CoV-2/FLU/RSV  testing.  Fact Sheet for Patients: https://www.moore.com/  Fact Sheet for Healthcare Providers: https://www.young.biz/  This test is not yet approved or cleared by the [place] FDA and  has been authorized for detection and/or diagnosis of SARS-CoV-2 by  FDA under an Emergency Use Authorization (EUA). This EUA will remain  in effect (meaning this test can be used) for the duration of the  Covid-19 declaration under Section 564(b)(1) of the Act, 21  U.S.C. section 360bbb-3(b)(1), unless the authorization is  terminated or revoked.    Respiratory Syncytial Virus by PCR POSITIVE (A) NEGATIVE Final    Comment: (NOTE) Fact Sheet for Patients: https://www.moore.com/  Fact Sheet for Healthcare Providers: https://www.young.biz/  This test is not yet approved or cleared by the [place] FDA and  has been  authorized for detection and/or diagnosis of SARS-CoV-2 by  FDA under an Emergency Use Authorization (EUA). This EUA will remain  in effect (meaning this test can be used) for the duration of the  COVID-19 declaration under Section 564(b)(1) of the Act, 21 U.S.C.  section 360bbb-3(b)(1), unless the authorization is terminated or  revoked. Performed at [facility], [address] [place], [place] [address]   Culture, blood (routine x 2) Call MD if unable to obtain prior to antibiotics being given     Status: None   Collection Time: 08/27/20  9:26 PM   Specimen: BLOOD  Result Value Ref Range Status   Specimen Description BLOOD LEFT [facility]  Final   Special Requests   Final    BOTTLES DRAWN AEROBIC AND ANAEROBIC Blood Culture adequate volume   Culture   Final    NO GROWTH 5 DAYS Performed at [facility], [address], [place], [place] [address]    Report Status 09/01/2020 FINAL  Final  Culture, blood (routine x 2) Call MD if unable to obtain prior to antibiotics being given     Status: None   Collection Time: 08/27/20  9:26 PM   Specimen: BLOOD LEFT HAND  Result Value Ref Range Status   Specimen Description BLOOD LEFT HAND  Final   Special Requests   Final    BOTTLES DRAWN AEROBIC AND ANAEROBIC Blood Culture adequate volume   Culture   Final    NO GROWTH 5 DAYS Performed at [facility], [address], [place], [place] [address]    Report Status 09/01/2020 FINAL  Final  Culture, sputum-assessment     Status: None   Collection Time: 08/28/20  5:30 AM   Specimen: Sputum  Result Value Ref Range Status   Specimen Description SPUTUM  Final   Special Requests NONE  Final   Sputum evaluation   Final    Sputum specimen not acceptable for testing.  Please recollect.   CALLED TO STEPHANIE RUDD AT [phone] ON 08/28/2020 [facility]. Performed at [facility], [address] [place], [place] [address]    Report Status 08/28/2020 FINAL  Final     Time  coordinating discharge: Over 30 minutes  SIGNED:   Lynn Ito, MD  [facility] 09/01/2020, 2:59 PM Pager   If 7PM-7AM, please contact night-coverage [email or web address] Password [email or web address]

## 2020-09-03 LAB — IGE: IgE (Immunoglobulin E), Serum: 561 IU/mL — ABNORMAL HIGH (ref 6–495)

## 2020-09-08 DIAGNOSIS — J449 Chronic obstructive pulmonary disease, unspecified: Secondary | ICD-10-CM | POA: Diagnosis not present

## 2020-09-08 DIAGNOSIS — R0600 Dyspnea, unspecified: Secondary | ICD-10-CM | POA: Diagnosis not present

## 2020-09-08 DIAGNOSIS — G4733 Obstructive sleep apnea (adult) (pediatric): Secondary | ICD-10-CM | POA: Diagnosis not present

## 2020-09-11 DIAGNOSIS — M9902 Segmental and somatic dysfunction of thoracic region: Secondary | ICD-10-CM | POA: Diagnosis not present

## 2020-09-11 DIAGNOSIS — M9903 Segmental and somatic dysfunction of lumbar region: Secondary | ICD-10-CM | POA: Diagnosis not present

## 2020-09-11 DIAGNOSIS — M9901 Segmental and somatic dysfunction of cervical region: Secondary | ICD-10-CM | POA: Diagnosis not present

## 2020-09-22 DIAGNOSIS — R002 Palpitations: Secondary | ICD-10-CM | POA: Diagnosis not present

## 2020-09-22 DIAGNOSIS — R000 Tachycardia, unspecified: Secondary | ICD-10-CM | POA: Diagnosis not present

## 2020-09-26 DIAGNOSIS — E282 Polycystic ovarian syndrome: Secondary | ICD-10-CM | POA: Diagnosis not present

## 2020-09-26 DIAGNOSIS — Z01419 Encounter for gynecological examination (general) (routine) without abnormal findings: Secondary | ICD-10-CM | POA: Diagnosis not present

## 2020-09-26 DIAGNOSIS — Z6841 Body Mass Index (BMI) 40.0 and over, adult: Secondary | ICD-10-CM | POA: Diagnosis not present

## 2020-09-26 DIAGNOSIS — J45909 Unspecified asthma, uncomplicated: Secondary | ICD-10-CM | POA: Insufficient documentation

## 2020-09-26 DIAGNOSIS — N6452 Nipple discharge: Secondary | ICD-10-CM | POA: Diagnosis not present

## 2020-09-26 LAB — HM PAP SMEAR: HM Pap smear: NEGATIVE

## 2020-10-05 DIAGNOSIS — F411 Generalized anxiety disorder: Secondary | ICD-10-CM | POA: Diagnosis not present

## 2020-10-09 DIAGNOSIS — M9902 Segmental and somatic dysfunction of thoracic region: Secondary | ICD-10-CM | POA: Diagnosis not present

## 2020-10-09 DIAGNOSIS — M9901 Segmental and somatic dysfunction of cervical region: Secondary | ICD-10-CM | POA: Diagnosis not present

## 2020-10-09 DIAGNOSIS — M9903 Segmental and somatic dysfunction of lumbar region: Secondary | ICD-10-CM | POA: Diagnosis not present

## 2020-10-17 ENCOUNTER — Ambulatory Visit: Payer: BC Managed Care – PPO | Admitting: Family Medicine

## 2020-10-30 DIAGNOSIS — M9902 Segmental and somatic dysfunction of thoracic region: Secondary | ICD-10-CM | POA: Diagnosis not present

## 2020-10-30 DIAGNOSIS — M9903 Segmental and somatic dysfunction of lumbar region: Secondary | ICD-10-CM | POA: Diagnosis not present

## 2020-10-30 DIAGNOSIS — M9901 Segmental and somatic dysfunction of cervical region: Secondary | ICD-10-CM | POA: Diagnosis not present

## 2020-11-01 ENCOUNTER — Other Ambulatory Visit: Payer: Self-pay

## 2020-11-01 ENCOUNTER — Ambulatory Visit: Payer: BC Managed Care – PPO | Admitting: Family Medicine

## 2020-11-01 DIAGNOSIS — J4520 Mild intermittent asthma, uncomplicated: Secondary | ICD-10-CM

## 2020-11-01 DIAGNOSIS — Z1159 Encounter for screening for other viral diseases: Secondary | ICD-10-CM | POA: Diagnosis not present

## 2020-11-01 DIAGNOSIS — F419 Anxiety disorder, unspecified: Secondary | ICD-10-CM

## 2020-11-01 DIAGNOSIS — F32A Depression, unspecified: Secondary | ICD-10-CM

## 2020-11-01 DIAGNOSIS — E559 Vitamin D deficiency, unspecified: Secondary | ICD-10-CM

## 2020-11-01 DIAGNOSIS — L308 Other specified dermatitis: Secondary | ICD-10-CM

## 2020-11-01 DIAGNOSIS — Z7689 Persons encountering health services in other specified circumstances: Secondary | ICD-10-CM

## 2020-11-01 LAB — CBC WITH DIFFERENTIAL/PLATELET
Basophils Absolute: 0 10*3/uL (ref 0.0–0.1)
Basophils Relative: 0.3 % (ref 0.0–3.0)
Eosinophils Absolute: 0.2 10*3/uL (ref 0.0–0.7)
Eosinophils Relative: 2.5 % (ref 0.0–5.0)
HCT: 41.2 % (ref 36.0–46.0)
Hemoglobin: 14.1 g/dL (ref 12.0–15.0)
Lymphocytes Relative: 22.3 % (ref 12.0–46.0)
Lymphs Abs: 1.9 10*3/uL (ref 0.7–4.0)
MCHC: 34.3 g/dL (ref 30.0–36.0)
MCV: 86.5 fl (ref 78.0–100.0)
Monocytes Absolute: 0.4 10*3/uL (ref 0.1–1.0)
Monocytes Relative: 5 % (ref 3.0–12.0)
Neutro Abs: 6 10*3/uL (ref 1.4–7.7)
Neutrophils Relative %: 69.9 % (ref 43.0–77.0)
Platelets: 274 10*3/uL (ref 150.0–400.0)
RBC: 4.76 Mil/uL (ref 3.87–5.11)
RDW: 14.5 % (ref 11.5–15.5)
WBC: 8.6 10*3/uL (ref 4.0–10.5)

## 2020-11-01 LAB — HEMOGLOBIN A1C: Hgb A1c MFr Bld: 5.4 % (ref 4.6–6.5)

## 2020-11-01 LAB — COMPREHENSIVE METABOLIC PANEL
ALT: 15 U/L (ref 0–35)
AST: 13 U/L (ref 0–37)
Albumin: 4.2 g/dL (ref 3.5–5.2)
Alkaline Phosphatase: 72 U/L (ref 39–117)
BUN: 13 mg/dL (ref 6–23)
CO2: 28 mEq/L (ref 19–32)
Calcium: 9.3 mg/dL (ref 8.4–10.5)
Chloride: 101 mEq/L (ref 96–112)
Creatinine, Ser: 0.84 mg/dL (ref 0.40–1.20)
GFR: 94.97 mL/min (ref >60.00)
Glucose, Bld: 100 mg/dL — ABNORMAL HIGH (ref 70–99)
Potassium: 4 mEq/L (ref 3.5–5.1)
Sodium: 137 mEq/L (ref 135–145)
Total Bilirubin: 0.4 mg/dL (ref 0.2–1.2)
Total Protein: 7 g/dL (ref 6.0–8.3)

## 2020-11-01 LAB — LIPID PANEL
Cholesterol: 203 mg/dL — ABNORMAL HIGH (ref 0–200)
HDL: 39.9 mg/dL (ref >39.00)
LDL Cholesterol: 129 mg/dL — ABNORMAL HIGH (ref 0–99)
NonHDL: 162.67
Total CHOL/HDL Ratio: 5
Triglycerides: 168 mg/dL — ABNORMAL HIGH (ref 0.0–149.0)
VLDL: 33.6 mg/dL (ref 0.0–40.0)

## 2020-11-01 LAB — VITAMIN D 25 HYDROXY (VIT D DEFICIENCY, FRACTURES): VITD: 18.13 ng/mL — ABNORMAL LOW (ref 30.00–100.00)

## 2020-11-01 MED ORDER — BUPROPION HCL ER (XL) 150 MG PO TB24: 150.0000 mg | ORAL_TABLET | Freq: Every day | ORAL | 2 refills | Status: DC

## 2020-11-01 MED ORDER — HYDROXYZINE HCL 25 MG PO TABS: 12.5000 mg | ORAL_TABLET | Freq: Four times a day (QID) | ORAL | 1 refills | Status: DC | PRN

## 2020-11-01 MED ORDER — BETAMETHASONE DIPROPIONATE 0.05 % EX CREA: TOPICAL_CREAM | Freq: Two times a day (BID) | CUTANEOUS | 1 refills | Status: DC

## 2020-11-01 NOTE — Patient Instructions (Addendum)
Good to see you today  Please schedule a follow up for 8 weeks  Bupropion tablets (Depression/Mood Disorders) What is this medicine? BUPROPION (byoo PROE pee on) is used to treat depression. This medicine Armbrust be used for other purposes; ask your health care provider or pharmacist if you have questions. COMMON BRAND NAME(S): Wellbutrin What should I tell my health care provider before I take this medicine? They need to know if you have any of these conditions:  an eating disorder, such as anorexia or bulimia  bipolar disorder or psychosis  diabetes or high blood sugar, treated with medication  glaucoma  heart disease, previous heart attack, or irregular heart beat  head injury or brain tumor  high blood pressure  kidney or liver disease  seizures  suicidal thoughts or a previous suicide attempt  Tourette's syndrome  weight loss  an unusual or allergic reaction to bupropion, other medicines, foods, dyes, or preservatives  breast-feeding  pregnant or trying to become pregnant How should I use this medicine? Take this medicine by mouth with a glass of water. Follow the directions on the prescription label. You can take it with or without food. If it upsets your stomach, take it with food. Take your medicine at regular intervals. Do not take your medicine more often than directed. Do not stop taking this medicine suddenly except upon the advice of your doctor. Stopping this medicine too quickly Bozzi cause serious side effects or your condition Nagorski worsen. A special MedGuide will be given to you by the pharmacist with each prescription and refill. Be sure to read this information carefully each time. Talk to your pediatrician regarding the use of this medicine in children. Special care Groesbeck be needed. Overdosage: If you think you have taken too much of this medicine contact a poison control center or emergency room at once. NOTE: This medicine is only for you. Do not share this  medicine with others. What if I miss a dose? If you miss a dose, take it as soon as you can. If it is less than four hours to your next dose, take only that dose and skip the missed dose. Do not take double or extra doses. What Kumpf interact with this medicine? Do not take this medicine with any of the following medications:  linezolid  MAOIs like Azilect, Carbex, Eldepryl, Marplan, Nardil, and Parnate  methylene blue (injected into a vein)  other medicines that contain bupropion like Zyban This medicine Hoke also interact with the following medications:  alcohol  certain medicines for anxiety or sleep  certain medicines for blood pressure like metoprolol, propranolol  certain medicines for depression or psychotic disturbances  certain medicines for HIV or AIDS like efavirenz, lopinavir, nelfinavir, ritonavir  certain medicines for irregular heart beat like propafenone, flecainide  certain medicines for Parkinson's disease like amantadine, levodopa  certain medicines for seizures like carbamazepine, phenytoin, phenobarbital  cimetidine  clopidogrel  cyclophosphamide  digoxin  furazolidone  isoniazid  nicotine  orphenadrine  procarbazine  steroid medicines like prednisone or cortisone  stimulant medicines for attention disorders, weight loss, or to stay awake  tamoxifen  theophylline  thiotepa  ticlopidine  tramadol  warfarin This list Manard not describe all possible interactions. Give your health care provider a list of all the medicines, herbs, non-prescription drugs, or dietary supplements you use. Also tell them if you smoke, drink alcohol, or use illegal drugs. Some items Holston interact with your medicine. What should I watch for while using this medicine?  Tell your doctor if your symptoms do not get better or if they get worse. Visit your doctor or healthcare provider for regular checks on your progress. Because it Delap take several weeks to see the  full effects of this medicine, it is important to continue your treatment as prescribed by your doctor. This medicine Lockamy cause serious skin reactions. They can happen weeks to months after starting the medicine. Contact your healthcare provider right away if you notice fevers or flu-like symptoms with a rash. The rash Hickson be red or purple and then turn into blisters or peeling of the skin. Or, you might notice a red rash with swelling of the face, lips or lymph nodes in your neck or under your arms. Patients and their families should watch out for new or worsening thoughts of suicide or depression. Also watch out for sudden changes in feelings such as feeling anxious, agitated, panicky, irritable, hostile, aggressive, impulsive, severely restless, overly excited and hyperactive, or not being able to sleep. If this happens, especially at the beginning of treatment or after a change in dose, call your healthcare provider. Avoid alcoholic drinks while taking this medicine. Drinking excessive alcoholic beverages, using sleeping or anxiety medicines, or quickly stopping the use of these agents while taking this medicine Trent increase your risk for a seizure. Do not drive or use heavy machinery until you know how this medicine affects you. This medicine can impair your ability to perform these tasks. Do not take this medicine close to bedtime. It Olejniczak prevent you from sleeping. Your mouth Tully get dry. Chewing sugarless gum or sucking hard candy, and drinking plenty of water Couchman help. Contact your doctor if the problem does not go away or is severe. What side effects Pavao I notice from receiving this medicine? Side effects that you should report to your doctor or health care professional as soon as possible:  allergic reactions like skin rash, itching or hives, swelling of the face, lips, or tongue  breathing problems  changes in vision  confusion  elevated mood, decreased need for sleep, racing thoughts,  impulsive behavior  fast or irregular heartbeat  hallucinations, loss of contact with reality  increased blood pressure  rash, fever, and swollen lymph nodes  redness, blistering, peeling, or loosening of the skin, including inside the mouth  seizures  suicidal thoughts or other mood changes  unusually weak or tired  vomiting Side effects that usually do not require medical attention (report to your doctor or health care professional if they continue or are bothersome):  constipation  headache  loss of appetite  nausea  tremors  weight loss This list Alvillar not describe all possible side effects. Call your doctor for medical advice about side effects. You Crewe report side effects to FDA at 1-800-FDA-1088. Where should I keep my medicine? Keep out of the reach of children. Store at room temperature between 20 and 25 degrees C (68 and 77 degrees F), away from direct sunlight and moisture. Keep tightly closed. Throw away any unused medicine after the expiration date. NOTE: This sheet is a summary. It Lipps not cover all possible information. If you have questions about this medicine, talk to your doctor, pharmacist, or health care provider.  2020 Elsevier/Gold Standard (2019-02-11 14:02:47)    There is not one right eating plan for everyone.  It Hartstein take trial and error to find what will work for you.  It is important to get adequate protein and fiber with your meals.  It is okay to not eat breakfast or to skip meals if you are not hungry.  Avoid snacking between meals.  Unless you are on a fluid restriction, drink 80 to 90 ounces of water a day.  Suggested resources- www.dietdoctor.com/diabetes/diet www.adaptyourlifeacademy.com-there is a quiz to help you determine how many carbohydrates you should eat a day  www.thefastingmethod.com  If you have diabetes or access to a blood sugar machine, I recommend you check your blood sugar daily and keep a log.  Vary the time you check  your blood sugar such as fasting, before meal, 2 hours after a meal and at bedtime.  Look for trends with the foods you are eating and be a scientist of your body.  Here are some guidelines to help you with meal planning -  Avoid all processed and packaged foods (bread, pasta, crackers, chips, etc) and beverages containing calories.  Avoid added sugars and excessive natural sugars.  Pay attention to how you feel if you consume artificial sweeteners.  Do they make you more hungry or raise your blood sugar?  With every meal and snack, aim to get 20 g of protein (3 ounces of meat, 4 ounces of fish, 3 eggs, protein powder, 1 cup [place] yogurt, 1 cup cottage cheese, etc.)  Increase fiber in the form of non-starchy vegetables.  These help you feel full with very little carbohydrates and are good for gut health.  Nonstarchy vegetables include summer squash, onions, peppers, tomatoes, eggplant, broccoli, cauliflower, cabbage, lettuce, spinach.  Have small amounts of good fats such as avocado, nuts, olive oil, nut butters, olives.  Add a little cheese to your meals to make them tasty.   Try to plan your meals for the week and do some meal preparation when able.  If possible, make lunches for the week ahead of time.  Plan a couple of dinners and make enough so you can have leftovers.  Build in a treat once a week.

## 2020-11-01 NOTE — Progress Notes (Signed)
Subjective:    Patient ID: Christine Valencia, female    DOB: 20-Mar-1991, 29 y.o.   MRN: [id number]  HPI Chief Complaint  Patient presents with  . New Patient (Initial Visit)   This is a 29 yo female who presents today to establish care. She lives with her husband and best friend, she is in school for vet tech/ vet behavior.   Last CPE- not for many years Pap- 09/26/20 Tdap- unsure Flu- declines Covid 19 vaccine- not vaccinated, not planning on getting vaccinated Eye-01/2018 Dental- not regular Exercise- walks her two dogs daily, total 45 minutes; dances on weekends. Yoga and breathing exercises.  Diet- has been traveling a lot recently, when home, does eggs/ sausage, yogurt, sometimes skips lunch, coffee with cream, juice, lost weight on keto  High stress- Dad with terminal cancer, lives in [place].   Pulmonary- has been following with pulmonary, has home sleep study scheduled for tomorrow. Has had issues with increased heart rate. Dizziness. Normal ECHO.   [facility]- [facility]  Anxiety/ depression- long standing, has therapist, doesn't have an appointment until Jan. Took Prozac in 2015, didn't help. Was on alprazolam in hospital. More anxious than depressed. Worse after hospitalization. Sleeping ok. Financial struggles.   Chronic neck pain- sees chiropractor, on supplements  Eczema-has history of eczema on hands, some improvement with moisturizers but still with some flaking especially with frequent handwashing  Hospitalized 08/27/2020 with bronchial spasm, RSV, very traumatized by hospitalization.   Review of Systems Per HPI    Objective:   Physical Exam Physical Exam  Constitutional: Oriented to person, place, and time. Appears well-developed and well-nourished. Morbidly obese.  HENT:  Head: Normocephalic and atraumatic.  Eyes: Conjunctivae are normal.  Neck: Normal range of motion. Neck supple.  Cardiovascular: Normal rate, regular rhythm and normal heart sounds.    Pulmonary/Chest: Effort normal and breath sounds normal.  Musculoskeletal: No lower extremity edema.   Neurological: Alert and oriented to person, place, and time.  Skin: Skin is warm and dry.  Bilateral hands with small amount flaking no erythema or drainage Psychiatric: Normal mood and affect. Behavior is normal. Judgment and thought content normal.  Vitals reviewed.    BP 100/80 (BP Location: Right Arm, Patient Position: Sitting, Cuff Size: Large)   Pulse (!) 109   Temp 97.8 F (36.6 C) (Temporal)   Ht 5' 6.5" (1.689 m)   Wt (!) 369 lb 1.9 oz (167.4 kg)   LMP 10/27/2020   SpO2 94%   BMI 58.68 kg/m  Wt Readings from Last 3 Encounters:  11/01/20 (!) 369 lb 1.9 oz (167.4 kg)  08/26/20 (!) 340 lb (154.2 kg)  04/17/20 (!) 340 lb (154.2 kg)   Depression screen PHQ 2/9 11/01/2020  Decreased Interest 3  Down, Depressed, Hopeless 1  PHQ - 2 Score 4  Altered sleeping 3  Tired, decreased energy 3  Change in appetite 2  Feeling bad or failure about yourself  0  Trouble concentrating 3  Moving slowly or fidgety/restless 0  Suicidal thoughts 0  PHQ-9 Score 15  Difficult doing work/chores Not difficult at all        Assessment & Plan:  1. Encounter to establish care -Reviewed available records in EMR -Discussed overdue health maintenance, encouraged her to consider flu vaccine, Tdap, Covid vaccine  2. Morbid obesity (HCC) -Discussed diet and exercise as tolerated - Lipid Panel - Hemoglobin A1c - Vitamin D, 25-hydroxy - CBC with Differential - Comprehensive metabolic panel  3. Anxiety and depression -  She has upcoming appointment with therapist and I encouraged her to keep this, we discussed medications and she was interested in bupropion.  Discussed potential side effects including increased anxiety.  Also provided her with hydroxyzine that she can use as needed. - buPROPion (WELLBUTRIN XL) 150 MG 24 hr tablet; Take 1 tablet (150 mg total) by mouth daily.  Dispense: 30  tablet; Refill: 2 - hydrOXYzine (ATARAX/VISTARIL) 25 MG tablet; Take 0.5-1 tablets (12.5-25 mg total) by mouth every 6 (six) hours as needed for anxiety.  Dispense: 30 tablet; Refill: 1 -Follow-up in 8 weeks  4. Encounter for hepatitis C screening test for low risk patient - Hepatitis C antibody  5. Other eczema - betamethasone dipropionate 0.05 % cream; Apply topically 2 (two) times daily. For no more tan 10 days  Dispense: 30 g; Refill: 1  6. Mild intermittent asthma without complication -Improved on Breo -Suspect anxiety component with symptoms as well -Follow-up with pulmonary as scheduled  This visit occurred during the SARS-CoV-2 public health emergency.  Safety protocols were in place, including screening questions prior to the visit, additional usage of staff PPE, and extensive cleaning of exam room while observing appropriate contact time as indicated for disinfecting solutions.      Olean Ree, FNP-BC  [facility], [facility]  11/05/2020 10:22 AM

## 2020-11-02 LAB — HEPATITIS C ANTIBODY
Hepatitis C Ab: NONREACTIVE
SIGNAL TO CUT-OFF: 0.03 (ref <1.00)

## 2020-11-05 ENCOUNTER — Encounter: Payer: Self-pay | Admitting: Family Medicine

## 2020-11-05 DIAGNOSIS — F419 Anxiety disorder, unspecified: Secondary | ICD-10-CM | POA: Insufficient documentation

## 2020-11-05 DIAGNOSIS — L309 Dermatitis, unspecified: Secondary | ICD-10-CM | POA: Insufficient documentation

## 2020-11-05 DIAGNOSIS — F32A Depression, unspecified: Secondary | ICD-10-CM | POA: Insufficient documentation

## 2020-11-05 MED ORDER — VITAMIN D3 1.25 MG (50000 UT) PO TABS: 1.0000 | ORAL_TABLET | ORAL | 1 refills | Status: AC

## 2020-11-05 NOTE — Addendum Note (Signed)
Addended by: Olean Ree B on: 11/05/2020 08:09 PM   Modules accepted: Orders

## 2020-11-09 DIAGNOSIS — N6452 Nipple discharge: Secondary | ICD-10-CM | POA: Diagnosis not present

## 2020-11-09 DIAGNOSIS — E669 Obesity, unspecified: Secondary | ICD-10-CM | POA: Diagnosis not present

## 2020-11-09 DIAGNOSIS — E282 Polycystic ovarian syndrome: Secondary | ICD-10-CM | POA: Diagnosis not present

## 2020-11-09 DIAGNOSIS — N939 Abnormal uterine and vaginal bleeding, unspecified: Secondary | ICD-10-CM | POA: Diagnosis not present

## 2020-11-12 DIAGNOSIS — R0600 Dyspnea, unspecified: Secondary | ICD-10-CM | POA: Diagnosis not present

## 2020-11-12 DIAGNOSIS — J449 Chronic obstructive pulmonary disease, unspecified: Secondary | ICD-10-CM | POA: Diagnosis not present

## 2020-11-16 DIAGNOSIS — G4733 Obstructive sleep apnea (adult) (pediatric): Secondary | ICD-10-CM | POA: Diagnosis not present

## 2020-11-16 DIAGNOSIS — Z7689 Persons encountering health services in other specified circumstances: Secondary | ICD-10-CM | POA: Diagnosis not present

## 2020-11-16 DIAGNOSIS — R0600 Dyspnea, unspecified: Secondary | ICD-10-CM | POA: Diagnosis not present

## 2020-11-16 DIAGNOSIS — J4520 Mild intermittent asthma, uncomplicated: Secondary | ICD-10-CM | POA: Diagnosis not present

## 2020-11-20 DIAGNOSIS — M9902 Segmental and somatic dysfunction of thoracic region: Secondary | ICD-10-CM | POA: Diagnosis not present

## 2020-11-20 DIAGNOSIS — M9903 Segmental and somatic dysfunction of lumbar region: Secondary | ICD-10-CM | POA: Diagnosis not present

## 2020-11-20 DIAGNOSIS — M9901 Segmental and somatic dysfunction of cervical region: Secondary | ICD-10-CM | POA: Diagnosis not present

## 2020-11-23 ENCOUNTER — Other Ambulatory Visit: Payer: Self-pay | Admitting: Family Medicine

## 2020-11-23 DIAGNOSIS — F32A Depression, unspecified: Secondary | ICD-10-CM

## 2020-11-23 DIAGNOSIS — F419 Anxiety disorder, unspecified: Secondary | ICD-10-CM

## 2020-12-04 DIAGNOSIS — R002 Palpitations: Secondary | ICD-10-CM | POA: Diagnosis not present

## 2020-12-04 DIAGNOSIS — R000 Tachycardia, unspecified: Secondary | ICD-10-CM | POA: Diagnosis not present

## 2020-12-08 ENCOUNTER — Encounter: Payer: Self-pay | Admitting: Family Medicine

## 2020-12-08 DIAGNOSIS — F411 Generalized anxiety disorder: Secondary | ICD-10-CM | POA: Diagnosis not present

## 2020-12-15 DIAGNOSIS — F411 Generalized anxiety disorder: Secondary | ICD-10-CM | POA: Diagnosis not present

## 2020-12-22 DIAGNOSIS — F411 Generalized anxiety disorder: Secondary | ICD-10-CM | POA: Diagnosis not present

## 2020-12-29 DIAGNOSIS — F411 Generalized anxiety disorder: Secondary | ICD-10-CM | POA: Diagnosis not present

## 2021-01-02 DIAGNOSIS — F411 Generalized anxiety disorder: Secondary | ICD-10-CM | POA: Diagnosis not present

## 2021-01-04 ENCOUNTER — Encounter: Payer: Self-pay | Admitting: Family Medicine

## 2021-01-04 ENCOUNTER — Other Ambulatory Visit: Payer: Self-pay

## 2021-01-04 ENCOUNTER — Ambulatory Visit: Payer: BC Managed Care – PPO | Admitting: Family Medicine

## 2021-01-04 VITALS — BP 110/78 | HR 90 | Temp 97.5°F | Ht 67.0 in | Wt 371.0 lb

## 2021-01-04 DIAGNOSIS — Z808 Family history of malignant neoplasm of other organs or systems: Secondary | ICD-10-CM | POA: Diagnosis not present

## 2021-01-04 DIAGNOSIS — F32A Depression, unspecified: Secondary | ICD-10-CM

## 2021-01-04 DIAGNOSIS — G43109 Migraine with aura, not intractable, without status migrainosus: Secondary | ICD-10-CM

## 2021-01-04 DIAGNOSIS — G43909 Migraine, unspecified, not intractable, without status migrainosus: Secondary | ICD-10-CM | POA: Insufficient documentation

## 2021-01-04 DIAGNOSIS — F411 Generalized anxiety disorder: Secondary | ICD-10-CM

## 2021-01-04 DIAGNOSIS — F331 Major depressive disorder, recurrent, moderate: Secondary | ICD-10-CM

## 2021-01-04 DIAGNOSIS — F419 Anxiety disorder, unspecified: Secondary | ICD-10-CM | POA: Diagnosis not present

## 2021-01-04 MED ORDER — HYDROXYZINE HCL 25 MG PO TABS: 12.5000 mg | ORAL_TABLET | Freq: Four times a day (QID) | ORAL | 1 refills | Status: DC | PRN

## 2021-01-04 MED ORDER — BUPROPION HCL ER (XL) 300 MG PO TB24: 300.0000 mg | ORAL_TABLET | Freq: Every day | ORAL | 3 refills | Status: DC

## 2021-01-04 NOTE — Patient Instructions (Addendum)
Check back in 3 months for depression   Let me know if anything changes - Boylen need to see ob/gyn for birth control - or for fertility evaluation questions   If doing well - you can also just mychart to update   #Referral I have placed a referral to a specialist for you. You should receive a phone call from the specialty office. Make sure your voicemail is not full and that if you are able to answer your phone to unknown or new numbers.   It Besancon take up to 2 weeks to hear about the referral. If you do not hear anything in 2 weeks, please call our office and ask to speak with the referral coordinator.

## 2021-01-04 NOTE — Assessment & Plan Note (Signed)
Dad with melanoma. She notes his was rare. Discussed derm evaluation and determine need for additional f/u after visit.

## 2021-01-04 NOTE — Assessment & Plan Note (Signed)
W/ aura. Managed with excedrin migraine. Less often. Discussed that this limits birth control options. She has an OB/GYN and will reach out to discuss options when she is interested.

## 2021-01-04 NOTE — Assessment & Plan Note (Signed)
She notes improvement, but still having symptoms and many life stressors. Increase wellbutrin 150 > 300 mg. Cont hydroxyzine 12.5-25 mg prn at night

## 2021-01-04 NOTE — Assessment & Plan Note (Signed)
Significant improvement, however, still with symptoms. Increase wellbutrin 150 mg > 300 mg.

## 2021-01-04 NOTE — Progress Notes (Signed)
Subjective:     Christine Valencia is a 30 y.o. female presenting for Follow-up and Transitions Of Care     HPI  #Depression - has noticed a significant improvement - going through a big transition - going back to work after 5 years - working in [place] - has a things going on - trying to buy a house, dad is ill, husband had health issues   Review of Systems  11/01/2020: Clinic - anxiety/depression - start wellbutrin and hydroxyzine. Asthma - Breo, eczema -0 betamethasone  Social History   Tobacco Use  Smoking Status Former Smoker  . Years: 10.00  . Quit date: 01/03/2020  . Years since quitting: 1.0  Smokeless Tobacco Never Used        Objective:    BP Readings from Last 3 Encounters:  01/04/21 110/78  11/01/20 100/80  09/01/20 119/76   Wt Readings from Last 3 Encounters:  01/04/21 (!) 371 lb (168.3 kg)  11/01/20 (!) 369 lb 1.9 oz (167.4 kg)  08/26/20 (!) 340 lb (154.2 kg)    BP 110/78   Pulse 90   Temp (!) 97.5 F (36.4 C) (Temporal)   Ht 5\' 7"  (1.702 m)   Wt (!) 371 lb (168.3 kg)   LMP 11/27/2020 (Exact Date)   SpO2 95%   BMI 58.11 kg/m    Physical Exam Constitutional:      General: She is not in acute distress.    Appearance: She is well-developed. She is not diaphoretic.  HENT:     Right Ear: External ear normal.     Left Ear: External ear normal.  Eyes:     Conjunctiva/sclera: Conjunctivae normal.  Cardiovascular:     Rate and Rhythm: Normal rate.  Pulmonary:     Effort: Pulmonary effort is normal.  Musculoskeletal:     Cervical back: Neck supple.  Skin:    General: Skin is warm and dry.     Capillary Refill: Capillary refill takes less than 2 seconds.  Neurological:     Mental Status: She is alert. Mental status is at baseline.  Psychiatric:        Mood and Affect: Mood normal.        Behavior: Behavior normal.           Assessment & Plan:   Problem List Items Addressed This Visit      Cardiovascular and Mediastinum    Migraines    W/ aura. Managed with excedrin migraine. Less often. Discussed that this limits birth control options. She has an OB/GYN and will reach out to discuss options when she is interested.       Relevant Medications   buPROPion (WELLBUTRIN XL) 300 MG 24 hr tablet     Other   Anxiety and depression   Relevant Medications   hydrOXYzine (ATARAX/VISTARIL) 25 MG tablet   buPROPion (WELLBUTRIN XL) 300 MG 24 hr tablet   Major depressive disorder, recurrent episode, moderate (HCC)    Significant improvement, however, still with symptoms. Increase wellbutrin 150 mg > 300 mg.       Relevant Medications   hydrOXYzine (ATARAX/VISTARIL) 25 MG tablet   buPROPion (WELLBUTRIN XL) 300 MG 24 hr tablet   Generalized anxiety disorder    She notes improvement, but still having symptoms and many life stressors. Increase wellbutrin 150 > 300 mg. Cont hydroxyzine 12.5-25 mg prn at night      Relevant Medications   hydrOXYzine (ATARAX/VISTARIL) 25 MG tablet   buPROPion (WELLBUTRIN XL) 300 MG  24 hr tablet   Family history of melanoma - Primary    Dad with melanoma. She notes his was rare. Discussed derm evaluation and determine need for additional f/u after visit.       Relevant Orders   Ambulatory referral to Dermatology       Return in about 3 months (around 04/03/2021).  Lynnda Child, MD  This visit occurred during the SARS-CoV-2 public health emergency.  Safety protocols were in place, including screening questions prior to the visit, additional usage of staff PPE, and extensive cleaning of exam room while observing appropriate contact time as indicated for disinfecting solutions.

## 2021-01-25 DIAGNOSIS — F411 Generalized anxiety disorder: Secondary | ICD-10-CM | POA: Diagnosis not present

## 2021-02-01 DIAGNOSIS — F411 Generalized anxiety disorder: Secondary | ICD-10-CM | POA: Diagnosis not present

## 2021-02-08 DIAGNOSIS — F411 Generalized anxiety disorder: Secondary | ICD-10-CM | POA: Diagnosis not present

## 2021-02-15 ENCOUNTER — Other Ambulatory Visit: Payer: Self-pay

## 2021-02-15 DIAGNOSIS — F411 Generalized anxiety disorder: Secondary | ICD-10-CM | POA: Diagnosis not present

## 2021-02-15 DIAGNOSIS — L308 Other specified dermatitis: Secondary | ICD-10-CM

## 2021-02-15 MED ORDER — BETAMETHASONE DIPROPIONATE 0.05 % EX CREA: TOPICAL_CREAM | Freq: Two times a day (BID) | CUTANEOUS | 1 refills | Status: DC

## 2021-02-15 NOTE — Telephone Encounter (Signed)
Pharmacy requests refill on: Betamethasone Dipropionate 0.05% Cream   LAST REFILL: 11/01/2020 (Q-30 g, R-1) LAST OV: 01/04/2021 NEXT OV: 04/03/2021 PHARMACY: [id number] [place], [place]

## 2021-02-22 DIAGNOSIS — F411 Generalized anxiety disorder: Secondary | ICD-10-CM | POA: Diagnosis not present

## 2021-03-01 DIAGNOSIS — F411 Generalized anxiety disorder: Secondary | ICD-10-CM | POA: Diagnosis not present

## 2021-03-05 DIAGNOSIS — F411 Generalized anxiety disorder: Secondary | ICD-10-CM | POA: Diagnosis not present

## 2021-03-06 DIAGNOSIS — M9903 Segmental and somatic dysfunction of lumbar region: Secondary | ICD-10-CM | POA: Diagnosis not present

## 2021-03-06 DIAGNOSIS — M9902 Segmental and somatic dysfunction of thoracic region: Secondary | ICD-10-CM | POA: Diagnosis not present

## 2021-03-06 DIAGNOSIS — M9901 Segmental and somatic dysfunction of cervical region: Secondary | ICD-10-CM | POA: Diagnosis not present

## 2021-03-08 DIAGNOSIS — D2271 Melanocytic nevi of right lower limb, including hip: Secondary | ICD-10-CM | POA: Diagnosis not present

## 2021-03-08 DIAGNOSIS — R21 Rash and other nonspecific skin eruption: Secondary | ICD-10-CM | POA: Diagnosis not present

## 2021-03-08 DIAGNOSIS — L578 Other skin changes due to chronic exposure to nonionizing radiation: Secondary | ICD-10-CM | POA: Diagnosis not present

## 2021-03-08 DIAGNOSIS — D2272 Melanocytic nevi of left lower limb, including hip: Secondary | ICD-10-CM | POA: Diagnosis not present

## 2021-03-13 DIAGNOSIS — F411 Generalized anxiety disorder: Secondary | ICD-10-CM | POA: Diagnosis not present

## 2021-03-19 DIAGNOSIS — F411 Generalized anxiety disorder: Secondary | ICD-10-CM | POA: Diagnosis not present

## 2021-03-26 DIAGNOSIS — F411 Generalized anxiety disorder: Secondary | ICD-10-CM | POA: Diagnosis not present

## 2021-04-03 ENCOUNTER — Ambulatory Visit: Payer: BC Managed Care – PPO | Admitting: Family Medicine

## 2021-04-03 ENCOUNTER — Other Ambulatory Visit: Payer: Self-pay

## 2021-04-03 VITALS — BP 118/78 | HR 85 | Temp 97.8°F | Ht 67.0 in | Wt 358.2 lb

## 2021-04-03 DIAGNOSIS — M9902 Segmental and somatic dysfunction of thoracic region: Secondary | ICD-10-CM | POA: Diagnosis not present

## 2021-04-03 DIAGNOSIS — L308 Other specified dermatitis: Secondary | ICD-10-CM

## 2021-04-03 DIAGNOSIS — F331 Major depressive disorder, recurrent, moderate: Secondary | ICD-10-CM

## 2021-04-03 DIAGNOSIS — F419 Anxiety disorder, unspecified: Secondary | ICD-10-CM

## 2021-04-03 DIAGNOSIS — M9901 Segmental and somatic dysfunction of cervical region: Secondary | ICD-10-CM | POA: Diagnosis not present

## 2021-04-03 DIAGNOSIS — F411 Generalized anxiety disorder: Secondary | ICD-10-CM

## 2021-04-03 DIAGNOSIS — R112 Nausea with vomiting, unspecified: Secondary | ICD-10-CM

## 2021-04-03 DIAGNOSIS — M9903 Segmental and somatic dysfunction of lumbar region: Secondary | ICD-10-CM | POA: Diagnosis not present

## 2021-04-03 DIAGNOSIS — F32A Depression, unspecified: Secondary | ICD-10-CM

## 2021-04-03 DIAGNOSIS — J302 Other seasonal allergic rhinitis: Secondary | ICD-10-CM

## 2021-04-03 MED ORDER — ESCITALOPRAM OXALATE 10 MG PO TABS: ORAL_TABLET | ORAL | 1 refills | Status: DC

## 2021-04-03 MED ORDER — BETAMETHASONE DIPROPIONATE 0.05 % EX CREA: TOPICAL_CREAM | Freq: Two times a day (BID) | CUTANEOUS | 1 refills | Status: AC

## 2021-04-03 MED ORDER — HYDROXYZINE HCL 25 MG PO TABS: 12.5000 mg | ORAL_TABLET | Freq: Four times a day (QID) | ORAL | 1 refills | Status: AC | PRN

## 2021-04-03 NOTE — Assessment & Plan Note (Signed)
Worse in setting of weaning wellbutrin 2/2 to side effects. Discussed calling in the future. Discussed lexapro vs effexor and will try Lexapro 10 mg increase to 20 mg after 1 week if tolerated. Return in 6 weeks. Reach out sooner if side effects. Cont therapy

## 2021-04-03 NOTE — Patient Instructions (Signed)
You are going to start a new antidepressant medication.   One of the risks of this medication is increase in suicidal thoughts.   Your suicide Action plan is as follows:  1) Call therapist 2) Call the Suicide Hotline 1-800-273-8255 which is available 24 hours 3) Call the Clinic   The most common side effect is stomach upset. If this happens it means the medication is working. It should get better in 1-3 weeks.   Medication for depression and anxiety often takes 6-8 weeks to have a noticeable difference so stick with it. Also the best way for recovery is taking medication and seeing a therapist -- this is so important.    How to help anxiety and depression  1) Regular Exercise - walking, jogging, cycling, dancing, strength training - aiming for 150 minutes of exercise a week --> Yoga has been shown in research to reduce depression and anxiety -- with even just one hour long session per week  2)  Begin a Mindfulness/Meditation practice -- this can take a little as 3 minutes and is helpful for all kinds of mood issues -- You can find resources in books -- Or you can download apps like  ---- Headspace App  ---- Calm  ---- Insignt Timer ---- Stop, Breathe & Think  # With each of these Apps - you should decline the "start free trial" offer and as you search through the App should be able to access some of their free content. You can also chose to pay for the content if you find one that works well for you.   # Many of them also offer sleep specific content which Donado help with insomnia  3) Healthy Diet -- Avoid or decrease Caffeine -- Avoid or decrease Alcohol -- Drink plenty of water, have a balanced diet -- Avoid cigarettes and marijuana (as well as other recreational drugs)  

## 2021-04-03 NOTE — Assessment & Plan Note (Signed)
Following with dermatology. Responding to betamethasone - refill provided.

## 2021-04-03 NOTE — Assessment & Plan Note (Signed)
Etiology unclear - suspect wellbutrin side effect as improving but with some lingering symptoms. Will watch and wait. If persisting Loeffelholz consider PPI trial and blood work at f/u visit.

## 2021-04-03 NOTE — Assessment & Plan Note (Signed)
~  13 lb weight loss. Unclear if this is 2/2 to nausea/vomiting due to wellbutrin or healthy diet/exercise. Encouraged continued exercise. Will continue to monitor

## 2021-04-03 NOTE — Progress Notes (Signed)
Subjective:     Christine Valencia is a 30 y.o. female presenting for Follow-up (3 month )     HPI   #Allegries - runny nose - zyrtec and benadryl - taking sinus medication w/ improvement - using flonase -   #Depression/anxiety - tapered off the wellbutrin as she was getting nausea/vomiting - even with lower dose of wellbutrin was still getting symptoms - still having some occasional nausea - took 3 negative pregnancy tests - still getting random nausea but overall improved since stopping the medication - previously took prozac for anxiety did not seem to help  #obesity - has lost some weight - but is walking and eating well   Nausea - no reflux - one episodes  Review of Systems  01/04/2021: Clinic - Depression/anxiety - increase welbutrin and cont hydroxyzine  Social History   Tobacco Use  Smoking Status Former Smoker  . Years: 10.00  . Quit date: 01/03/2020  . Years since quitting: 1.2  Smokeless Tobacco Never Used        Objective:    BP Readings from Last 3 Encounters:  04/03/21 118/78  01/04/21 110/78  11/01/20 100/80   Wt Readings from Last 3 Encounters:  04/03/21 (!) 358 lb 4 oz (162.5 kg)  01/04/21 (!) 371 lb (168.3 kg)  11/01/20 (!) 369 lb 1.9 oz (167.4 kg)    BP 118/78   Pulse 85   Temp 97.8 F (36.6 C) (Temporal)   Ht 5\' 7"  (1.702 m)   Wt (!) 358 lb 4 oz (162.5 kg)   LMP 03/24/2021 (Exact Date)   SpO2 96%   BMI 56.11 kg/m    Physical Exam Constitutional:      General: She is not in acute distress.    Appearance: She is well-developed. She is not diaphoretic.  HENT:     Right Ear: External ear normal.     Left Ear: External ear normal.  Eyes:     Conjunctiva/sclera: Conjunctivae normal.  Cardiovascular:     Rate and Rhythm: Normal rate.  Pulmonary:     Effort: Pulmonary effort is normal.  Musculoskeletal:     Cervical back: Neck supple.  Skin:    General: Skin is warm and dry.     Capillary Refill: Capillary refill takes less  than 2 seconds.  Neurological:     Mental Status: She is alert. Mental status is at baseline.  Psychiatric:        Mood and Affect: Mood normal.        Behavior: Behavior normal.    Depression screen [facility] 2/9 04/03/2021 01/04/2021 11/01/2020  Decreased Interest 2 1 3   Down, Depressed, Hopeless 1 1 1   PHQ - 2 Score 3 2 4   Altered sleeping 2 1 3   Tired, decreased energy 3 2 3   Change in appetite 1 2 2   Feeling bad or failure about yourself  1 0 0  Trouble concentrating 3 2 3   Moving slowly or fidgety/restless 0 0 0  Suicidal thoughts 0 0 0  PHQ-9 Score 13 9 15   Difficult doing work/chores Very difficult Somewhat difficult Not difficult at all   GAD 7 : Generalized Anxiety Score 04/03/2021 01/04/2021  Nervous, Anxious, on Edge 2 2  Control/stop worrying 1 3  Worry too much - different things 3 3  Trouble relaxing 3 3  Restless 1 1  Easily annoyed or irritable 2 1  Afraid - awful might happen 1 1  Total GAD 7 Score 13 14  Anxiety  Difficulty - Somewhat difficult          Assessment & Plan:   Problem List Items Addressed This Visit      Digestive   Nausea and vomiting    Etiology unclear - suspect wellbutrin side effect as improving but with some lingering symptoms. Will watch and wait. If persisting [place] consider PPI trial and blood work at f/u visit.         Musculoskeletal and Integument   Eczema    Following with dermatology. Responding to betamethasone - refill provided.       Relevant Medications   betamethasone dipropionate 0.05 % cream     Other   Morbid obesity (HCC)    ~13 lb weight loss. Unclear if this is 2/2 to nausea/vomiting due to wellbutrin or healthy diet/exercise. Encouraged continued exercise. Will continue to monitor      Anxiety and depression   Relevant Medications   escitalopram (LEXAPRO) 10 MG tablet   hydrOXYzine (ATARAX/VISTARIL) 25 MG tablet   Major depressive disorder, recurrent episode, moderate (HCC)    Worse in setting of weaning  wellbutrin 2/2 to side effects. Discussed calling in the future. Discussed lexapro vs effexor and will try Lexapro 10 mg increase to 20 mg after 1 week if tolerated. Return in 6 weeks. Reach out sooner if side effects. Cont therapy      Relevant Medications   escitalopram (LEXAPRO) 10 MG tablet   hydrOXYzine (ATARAX/VISTARIL) 25 MG tablet   Generalized anxiety disorder - Primary    See Depression plan. Worse. Start lexapro 10 mg. Cont hydroxyzine 12.5 mg AM and 25 mg PM with hope to wean if symptoms improve on new medication.       Relevant Medications   escitalopram (LEXAPRO) 10 MG tablet   hydrOXYzine (ATARAX/VISTARIL) 25 MG tablet   Seasonal allergies    Improving on zyrtec, flonase. Briefly discussed singulair as an option but as symptoms stable and starting lexapro will wait but can consider in the future.           Return in about 6 weeks (around 05/15/2021) for depression/anxiety.  Lynnda Child, MD  This visit occurred during the SARS-CoV-2 public health emergency.  Safety protocols were in place, including screening questions prior to the visit, additional usage of staff PPE, and extensive cleaning of exam room while observing appropriate contact time as indicated for disinfecting solutions.

## 2021-04-03 NOTE — Assessment & Plan Note (Signed)
See Depression plan. Worse. Start lexapro 10 mg. Cont hydroxyzine 12.5 mg AM and 25 mg PM with hope to wean if symptoms improve on new medication.

## 2021-04-03 NOTE — Assessment & Plan Note (Signed)
Improving on zyrtec, flonase. Briefly discussed singulair as an option but as symptoms stable and starting lexapro will wait but can consider in the future.

## 2021-04-09 DIAGNOSIS — F411 Generalized anxiety disorder: Secondary | ICD-10-CM | POA: Diagnosis not present

## 2021-04-11 ENCOUNTER — Encounter: Payer: Self-pay | Admitting: Family Medicine

## 2021-04-16 DIAGNOSIS — F411 Generalized anxiety disorder: Secondary | ICD-10-CM | POA: Diagnosis not present

## 2021-04-25 ENCOUNTER — Other Ambulatory Visit: Payer: Self-pay | Admitting: Family Medicine

## 2021-04-25 DIAGNOSIS — F32A Depression, unspecified: Secondary | ICD-10-CM

## 2021-04-25 DIAGNOSIS — F411 Generalized anxiety disorder: Secondary | ICD-10-CM

## 2021-04-25 DIAGNOSIS — F419 Anxiety disorder, unspecified: Secondary | ICD-10-CM

## 2021-05-09 DIAGNOSIS — F411 Generalized anxiety disorder: Secondary | ICD-10-CM | POA: Diagnosis not present

## 2021-05-15 ENCOUNTER — Ambulatory Visit: Payer: BC Managed Care – PPO | Admitting: Family Medicine

## 2021-05-21 DIAGNOSIS — F411 Generalized anxiety disorder: Secondary | ICD-10-CM | POA: Diagnosis not present

## 2021-05-23 ENCOUNTER — Ambulatory Visit: Payer: BC Managed Care – PPO | Admitting: Family Medicine

## 2021-05-23 DIAGNOSIS — Z0289 Encounter for other administrative examinations: Secondary | ICD-10-CM

## 2021-05-25 ENCOUNTER — Other Ambulatory Visit: Payer: Self-pay | Admitting: Family Medicine

## 2021-05-25 DIAGNOSIS — F419 Anxiety disorder, unspecified: Secondary | ICD-10-CM

## 2021-05-25 DIAGNOSIS — F32A Depression, unspecified: Secondary | ICD-10-CM

## 2021-05-25 DIAGNOSIS — F411 Generalized anxiety disorder: Secondary | ICD-10-CM

## 2021-05-27 ENCOUNTER — Other Ambulatory Visit: Payer: Self-pay | Admitting: Family Medicine

## 2021-05-27 DIAGNOSIS — F32A Depression, unspecified: Secondary | ICD-10-CM

## 2021-05-27 DIAGNOSIS — F411 Generalized anxiety disorder: Secondary | ICD-10-CM

## 2021-05-28 DIAGNOSIS — F411 Generalized anxiety disorder: Secondary | ICD-10-CM | POA: Diagnosis not present

## 2021-06-05 ENCOUNTER — Other Ambulatory Visit: Payer: Self-pay | Admitting: Family Medicine

## 2021-06-05 DIAGNOSIS — F411 Generalized anxiety disorder: Secondary | ICD-10-CM

## 2021-06-05 DIAGNOSIS — F32A Depression, unspecified: Secondary | ICD-10-CM

## 2021-06-06 ENCOUNTER — Telehealth: Payer: Self-pay | Admitting: *Deleted

## 2021-06-06 ENCOUNTER — Other Ambulatory Visit: Payer: Self-pay

## 2021-06-06 ENCOUNTER — Ambulatory Visit: Payer: BC Managed Care – PPO | Admitting: Family Medicine

## 2021-06-06 ENCOUNTER — Encounter: Payer: Self-pay | Admitting: Family Medicine

## 2021-06-06 DIAGNOSIS — F419 Anxiety disorder, unspecified: Secondary | ICD-10-CM

## 2021-06-06 DIAGNOSIS — E282 Polycystic ovarian syndrome: Secondary | ICD-10-CM | POA: Diagnosis not present

## 2021-06-06 DIAGNOSIS — F411 Generalized anxiety disorder: Secondary | ICD-10-CM

## 2021-06-06 DIAGNOSIS — F32A Depression, unspecified: Secondary | ICD-10-CM

## 2021-06-06 DIAGNOSIS — F331 Major depressive disorder, recurrent, moderate: Secondary | ICD-10-CM

## 2021-06-06 MED ORDER — ESCITALOPRAM OXALATE 20 MG PO TABS: 20.0000 mg | ORAL_TABLET | Freq: Every day | ORAL | 1 refills | Status: DC

## 2021-06-06 MED ORDER — OZEMPIC (0.25 OR 0.5 MG/DOSE) 2 MG/1.5ML ~~LOC~~ SOPN: 0.2500 mg | PEN_INJECTOR | SUBCUTANEOUS | 1 refills | Status: DC

## 2021-06-06 NOTE — Assessment & Plan Note (Signed)
Difficulty losing weight with healthy diet and calorie restriction as well as exercise. Discussed ozempic which is covered by insurance. Will start ozempic with plan to return in 3 months for weight check.

## 2021-06-06 NOTE — Progress Notes (Signed)
Subjective:     Christine Valencia is a 30 y.o. female presenting for Follow-up (ANX/DEP)     HPI  #anxiety/depression - taking lexapro - doing well  - also taking hydroxyzine 1/2 pill daily and full pill at night - dad in the hospital currently - which is added stress - still going to therapy  - has been working a lot of overtime so she is tired - has been working 60+ hour weeks  - but this is improving and hoping to work less - has just increased the lexapro to 20 mg  - has not been sleeping as well   #osa - just got her cpap - does take it off unintentionally overnight - can noticed a huge difference   #weight gain - walking miles per day at work - low carb diet - has gone to a dietician before - limits eating out - or tries to make healthy choices  - keto factor meals - chicken/veggies   Review of Systems  04/03/21: Clinic - Anxiety/depression - weaning wellbutrin and worse. Start lexapro. Some n/v though to be 2/2 to meds 04/11/21: mychart - blood on TP, dry bm prior to episode - hemorroid advice   Social History   Tobacco Use  Smoking Status Former   Years: 10.00   Pack years: 0.00   Types: Cigarettes   Quit date: 01/03/2020   Years since quitting: 1.4  Smokeless Tobacco Never        Objective:    BP Readings from Last 3 Encounters:  06/06/21 130/80  04/03/21 118/78  01/04/21 110/78   Wt Readings from Last 3 Encounters:  06/06/21 (!) 362 lb 12 oz (164.5 kg)  04/03/21 (!) 358 lb 4 oz (162.5 kg)  01/04/21 (!) 371 lb (168.3 kg)    BP 130/80   Pulse 87   Temp 98.1 F (36.7 C) (Temporal)   Ht 5\' 7"  (1.702 m)   Wt (!) 362 lb 12 oz (164.5 kg)   LMP 04/15/2021 (Approximate)   SpO2 94%   BMI 56.81 kg/m    Physical Exam Constitutional:      General: She is not in acute distress.    Appearance: She is well-developed. She is not diaphoretic.  HENT:     Right Ear: External ear normal.     Left Ear: External ear normal.     Nose: Nose normal.  Eyes:      Conjunctiva/sclera: Conjunctivae normal.  Cardiovascular:     Rate and Rhythm: Normal rate.  Pulmonary:     Effort: Pulmonary effort is normal.  Musculoskeletal:     Cervical back: Neck supple.  Skin:    General: Skin is warm and dry.     Capillary Refill: Capillary refill takes less than 2 seconds.  Neurological:     Mental Status: She is alert. Mental status is at baseline.  Psychiatric:        Mood and Affect: Mood normal.        Behavior: Behavior normal.    Depression screen [facility] 2/9 06/06/2021 04/03/2021 01/04/2021  Decreased Interest 1 2 1   Down, Depressed, Hopeless 1 1 1   PHQ - 2 Score 2 3 2   Altered sleeping 0 2 1  Tired, decreased energy 2 3 2   Change in appetite 0 1 2  Feeling bad or failure about yourself  0 1 0  Trouble concentrating 1 3 2   Moving slowly or fidgety/restless 0 0 0  Suicidal thoughts 0 0 0  PHQ-9  Score 5 13 9   Difficult doing work/chores Somewhat difficult Very difficult Somewhat difficult   GAD 7 : Generalized Anxiety Score 06/06/2021 04/03/2021 01/04/2021  Nervous, Anxious, on Edge 1 2 2   Control/stop worrying 1 1 3   Worry too much - different things 1 3 3   Trouble relaxing 1 3 3   Restless 0 1 1  Easily annoyed or irritable 0 2 1  Afraid - awful might happen 0 1 1  Total GAD 7 Score 4 13 14   Anxiety Difficulty Somewhat difficult - Somewhat difficult         Assessment & Plan:   Problem List Items Addressed This Visit       Endocrine   PCOS (polycystic ovarian syndrome)   Relevant Medications   Semaglutide,0.25 or 0.5MG /DOS, (OZEMPIC, 0.25 OR 0.5 MG/DOSE,) 2 MG/1.5ML SOPN     Other   Morbid obesity (HCC) - Primary    Difficulty losing weight with healthy diet and calorie restriction as well as exercise. Discussed ozempic which is covered by insurance. Will start ozempic with plan to return in 3 months for weight check.        Relevant Medications   Semaglutide,0.25 or 0.5MG /DOS, (OZEMPIC, 0.25 OR 0.5 MG/DOSE,) 2 MG/1.5ML SOPN    Anxiety and depression   Relevant Medications   escitalopram (LEXAPRO) 20 MG tablet   Major depressive disorder, recurrent episode, moderate (HCC)    Significant improvement. Cont lexapro 20 mg. Return prn       Relevant Medications   escitalopram (LEXAPRO) 20 MG tablet   Generalized anxiety disorder    Improvement. Cont lexapro 20 mg and hydroxyzine prn. Return as needed       Relevant Medications   escitalopram (LEXAPRO) 20 MG tablet     Return if symptoms worsen or fail to improve.  03/04/2021, MD  This visit occurred during the SARS-CoV-2 public health emergency.  Safety protocols were in place, including screening questions prior to the visit, additional usage of staff PPE, and extensive cleaning of exam room while observing appropriate contact time as indicated for disinfecting solutions.

## 2021-06-06 NOTE — Telephone Encounter (Signed)
Received fax from [facility] requesting PA for Ozempic.  Looks like medication is being prescribed for weight loss.  MD will need to submit new Rx written for [facility] and mark DAW (dispense as written).

## 2021-06-06 NOTE — Assessment & Plan Note (Signed)
Significant improvement. Cont lexapro 20 mg. Return prn

## 2021-06-06 NOTE — Assessment & Plan Note (Signed)
Improvement. Cont lexapro 20 mg and hydroxyzine prn. Return as needed

## 2021-06-06 NOTE — Patient Instructions (Addendum)
Check with insurance - see if PCOS would cover it - obesity - mychart if covered and wanting to start  Semaglutide injection solution What is this medication? SEMAGLUTIDE (Sem a GLOO tide) is used to improve blood sugar control in adults with type 2 diabetes. This medicine Hoes be used with other diabetes medicines. This drug Fann also reduce the risk of heart attack or stroke if you have type 2diabetes and risk factors for heart disease. This medicine Holsomback be used for other purposes; ask your health care provider orpharmacist if you have questions. COMMON BRAND NAME(S): OZEMPIC/Wegovy  What should I tell my care team before I take this medication? They need to know if you have any of these conditions: endocrine tumors (MEN 2) or if someone in your family had these tumors eye disease, vision problems history of pancreatitis kidney disease stomach problems thyroid cancer or if someone in your family had thyroid cancer an unusual or allergic reaction to semaglutide, other medicines, foods, dyes, or preservatives pregnant or trying to get pregnant breast-feeding How should I use this medication? This medicine is for injection under the skin of your upper leg (thigh), stomach area, or upper arm. It is given once every week (every 7 days). You will be taught how to prepare and give this medicine. Use exactly as directed. Take your medicine at regular intervals. Do not take it more often thandirected. If you use this medicine with insulin, you should inject this medicine and the insulin separately. Do not mix them together. Do not give the injections rightnext to each other. Change (rotate) injection sites with each injection. It is important that you put your used needles and syringes in a special sharps container. Do not put them in a trash can. If you do not have a sharpscontainer, call your pharmacist or healthcare provider to get one. A special MedGuide will be given to you by the pharmacist  with eachprescription and refill. Be sure to read this information carefully each time. This drug comes with INSTRUCTIONS FOR USE. Ask your pharmacist for directions on how to use this drug. Read the information carefully. Talk to yourpharmacist or health care provider if you have questions. Talk to your pediatrician regarding the use of this medicine in children.Special care Alig be needed. Overdosage: If you think you have taken too much of this medicine contact apoison control center or emergency room at once. NOTE: This medicine is only for you. Do not share this medicine with others. What if I miss a dose? If you miss a dose, take it as soon as you can within 5 days after the missed dose. Then take your next dose at your regular weekly time. If it has been longer than 5 days after the missed dose, do not take the missed dose. Take the next dose at your regular time. Do not take double or extra doses. If you havequestions about a missed dose, contact your health care provider for advice. What Simmon interact with this medication? other medicines for diabetes Many medications Westby cause changes in blood sugar, these include: alcohol containing beverages antiviral medicines for HIV or AIDS aspirin and aspirin-like drugs certain medicines for blood pressure, heart disease, irregular heart beat chromium diuretics female hormones, such as estrogens or progestins, birth control pills fenofibrate gemfibrozil isoniazid lanreotide female hormones or anabolic steroids MAOIs like Carbex, Eldepryl, Marplan, Nardil, and Parnate medicines for weight loss medicines for allergies, asthma, cold, or cough medicines for depression, anxiety, or psychotic disturbances niacin nicotine  NSAIDs, medicines for pain and inflammation, like ibuprofen or naproxen octreotide pasireotide pentamidine phenytoin probenecid quinolone antibiotics such as ciprofloxacin, levofloxacin, ofloxacin some herbal dietary  supplements steroid medicines such as prednisone or cortisone sulfamethoxazole; trimethoprim thyroid hormones Some medications can hide the warning symptoms of low blood sugar (hypoglycemia). You Stankovich need to monitor your blood sugar more closely if youare taking one of these medications. These include: beta-blockers, often used for high blood pressure or heart problems (examples include atenolol, metoprolol, propranolol) clonidine guanethidine reserpine This list Huot not describe all possible interactions. Give your health care provider a list of all the medicines, herbs, non-prescription drugs, or dietary supplements you use. Also tell them if you smoke, drink alcohol, or use illegaldrugs. Some items Oshel interact with your medicine. What should I watch for while using this medication? Visit your doctor or health care professional for regular checks on yourprogress. Drink plenty of fluids while taking this medicine. Check with your doctor or health care professional if you get an attack of severe diarrhea, nausea, and vomiting. The loss of too much body fluid can make it dangerous for you to takethis medicine. A test called the HbA1C (A1C) will be monitored. This is a simple blood test. It measures your blood sugar control over the last 2 to 3 months. You willreceive this test every 3 to 6 months. Learn how to check your blood sugar. Learn the symptoms of low and high bloodsugar and how to manage them. Always carry a quick-source of sugar with you in case you have symptoms of low blood sugar. Examples include hard sugar candy or glucose tablets. Make sure others know that you can choke if you eat or drink when you develop serious symptoms of low blood sugar, such as seizures or unconsciousness. They must getmedical help at once. Tell your doctor or health care professional if you have high blood sugar. You might need to change the dose of your medicine. If you are sick or exercisingmore than usual,  you might need to change the dose of your medicine. Do not skip meals. Ask your doctor or health care professional if you should avoid alcohol. Many nonprescription cough and cold products contain sugar oralcohol. These can affect blood sugar. Pens should never be shared. Even if the needle is changed, sharing Lauman resultin passing of viruses like hepatitis or HIV. Wear a medical ID bracelet or chain, and carry a card that describes yourdisease and details of your medicine and dosage times. Do not become pregnant while taking this medicine. Women should inform their doctor if they wish to become pregnant or think they might be pregnant. There is a potential for serious side effects to an unborn child. Talk to your healthcare professional or pharmacist for more information. What side effects Vandeventer I notice from receiving this medication? Side effects that you should report to your doctor or health care professionalas soon as possible: allergic reactions like skin rash, itching or hives, swelling of the face, lips, or tongue breathing problems changes in vision diarrhea that continues or is severe lump or swelling on the neck severe nausea signs and symptoms of infection like fever or chills; cough; sore throat; pain or trouble passing urine signs and symptoms of low blood sugar such as feeling anxious, confusion, dizziness, increased hunger, unusually weak or tired, sweating, shakiness, cold, irritable, headache, blurred vision, fast heartbeat, loss of consciousness signs and symptoms of kidney injury like trouble passing urine or change in the amount of urine trouble swallowing  unusual stomach upset or pain vomiting Side effects that usually do not require medical attention (report to yourdoctor or health care professional if they continue or are bothersome): constipation diarrhea nausea pain, redness, or irritation at site where injected stomach upset This list Delrosario not describe all possible  side effects. Call your doctor for medical advice about side effects. You Freer report side effects to FDA at1-800-FDA-1088. Where should I keep my medication? Keep out of the reach of children. Store unopened pens in a refrigerator between 2 and 8 degrees C (36 and 46 degrees F). Do not freeze. Protect from light and heat. After you first use the pen, it can be stored for 56 days at room temperature between 15 and 30 degrees C (59 and 86 degrees F) or in a refrigerator. Throw away your used pen after 56days or after the expiration date, whichever comes first. Do not store your pen with the needle attached. If the needle is left on,medicine Olivera leak from the pen. NOTE: This sheet is a summary. It Patrie not cover all possible information. If you have questions about this medicine, talk to your doctor, pharmacist, orhealth care provider.  2022 Elsevier/Gold Standard (2019-08-03 09:41:51)

## 2021-06-07 ENCOUNTER — Telehealth: Payer: Self-pay

## 2021-06-07 MED ORDER — WEGOVY 0.25 MG/0.5ML ~~LOC~~ SOAJ: 0.2500 mg | SUBCUTANEOUS | 1 refills | Status: DC

## 2021-06-07 NOTE — Telephone Encounter (Signed)
[facility] the following Rx:  Wegovy 0.25MG /0.5ML auto-injectors  PA submitted through Cover my meds and request approved.   Coverage: 05/08/2021- 01/03/2022;

## 2021-06-07 NOTE — Telephone Encounter (Signed)
Medication sent to pharmacy  

## 2021-06-07 NOTE — Addendum Note (Signed)
Addended by: Gweneth Dimitri R on: 06/07/2021 10:15 AM   Modules accepted: Orders

## 2021-06-12 DIAGNOSIS — F411 Generalized anxiety disorder: Secondary | ICD-10-CM | POA: Diagnosis not present

## 2021-06-19 DIAGNOSIS — F32A Depression, unspecified: Secondary | ICD-10-CM | POA: Diagnosis not present

## 2021-06-19 DIAGNOSIS — F411 Generalized anxiety disorder: Secondary | ICD-10-CM | POA: Diagnosis not present

## 2021-06-21 DIAGNOSIS — F411 Generalized anxiety disorder: Secondary | ICD-10-CM | POA: Diagnosis not present

## 2021-06-21 DIAGNOSIS — F32A Depression, unspecified: Secondary | ICD-10-CM | POA: Diagnosis not present

## 2021-06-26 DIAGNOSIS — F411 Generalized anxiety disorder: Secondary | ICD-10-CM | POA: Diagnosis not present

## 2021-06-26 DIAGNOSIS — F32A Depression, unspecified: Secondary | ICD-10-CM | POA: Diagnosis not present

## 2021-07-02 DIAGNOSIS — F411 Generalized anxiety disorder: Secondary | ICD-10-CM | POA: Diagnosis not present

## 2021-07-02 DIAGNOSIS — F32A Depression, unspecified: Secondary | ICD-10-CM | POA: Diagnosis not present

## 2021-07-03 DIAGNOSIS — M5127 Other intervertebral disc displacement, lumbosacral region: Secondary | ICD-10-CM | POA: Diagnosis not present

## 2021-07-03 DIAGNOSIS — M5125 Other intervertebral disc displacement, thoracolumbar region: Secondary | ICD-10-CM | POA: Diagnosis not present

## 2021-07-05 DIAGNOSIS — F411 Generalized anxiety disorder: Secondary | ICD-10-CM | POA: Diagnosis not present

## 2021-07-05 DIAGNOSIS — F32A Depression, unspecified: Secondary | ICD-10-CM | POA: Diagnosis not present

## 2021-07-09 DIAGNOSIS — F32A Depression, unspecified: Secondary | ICD-10-CM | POA: Diagnosis not present

## 2021-07-09 DIAGNOSIS — F411 Generalized anxiety disorder: Secondary | ICD-10-CM | POA: Diagnosis not present

## 2021-07-12 DIAGNOSIS — L308 Other specified dermatitis: Secondary | ICD-10-CM | POA: Diagnosis not present

## 2021-07-12 DIAGNOSIS — R21 Rash and other nonspecific skin eruption: Secondary | ICD-10-CM | POA: Diagnosis not present

## 2021-07-16 DIAGNOSIS — F411 Generalized anxiety disorder: Secondary | ICD-10-CM | POA: Diagnosis not present

## 2021-07-16 DIAGNOSIS — F32A Depression, unspecified: Secondary | ICD-10-CM | POA: Diagnosis not present

## 2021-07-20 DIAGNOSIS — M5416 Radiculopathy, lumbar region: Secondary | ICD-10-CM | POA: Diagnosis not present

## 2021-07-23 DIAGNOSIS — F32A Depression, unspecified: Secondary | ICD-10-CM | POA: Diagnosis not present

## 2021-07-23 DIAGNOSIS — F411 Generalized anxiety disorder: Secondary | ICD-10-CM | POA: Diagnosis not present

## 2021-07-26 DIAGNOSIS — L905 Scar conditions and fibrosis of skin: Secondary | ICD-10-CM | POA: Diagnosis not present

## 2021-07-26 DIAGNOSIS — R21 Rash and other nonspecific skin eruption: Secondary | ICD-10-CM | POA: Diagnosis not present

## 2021-07-30 DIAGNOSIS — M5127 Other intervertebral disc displacement, lumbosacral region: Secondary | ICD-10-CM | POA: Diagnosis not present

## 2021-07-30 DIAGNOSIS — Z79899 Other long term (current) drug therapy: Secondary | ICD-10-CM | POA: Diagnosis not present

## 2021-07-30 DIAGNOSIS — Z5181 Encounter for therapeutic drug level monitoring: Secondary | ICD-10-CM | POA: Diagnosis not present

## 2021-07-30 DIAGNOSIS — M5416 Radiculopathy, lumbar region: Secondary | ICD-10-CM | POA: Diagnosis not present

## 2021-08-02 DIAGNOSIS — F411 Generalized anxiety disorder: Secondary | ICD-10-CM | POA: Diagnosis not present

## 2021-08-02 DIAGNOSIS — F32A Depression, unspecified: Secondary | ICD-10-CM | POA: Diagnosis not present

## 2021-08-07 DIAGNOSIS — M5116 Intervertebral disc disorders with radiculopathy, lumbar region: Secondary | ICD-10-CM | POA: Diagnosis not present

## 2021-08-10 DIAGNOSIS — F32A Depression, unspecified: Secondary | ICD-10-CM | POA: Diagnosis not present

## 2021-08-10 DIAGNOSIS — F411 Generalized anxiety disorder: Secondary | ICD-10-CM | POA: Diagnosis not present

## 2021-08-20 ENCOUNTER — Encounter: Payer: Self-pay | Admitting: Family Medicine

## 2021-08-21 DIAGNOSIS — F32A Depression, unspecified: Secondary | ICD-10-CM | POA: Diagnosis not present

## 2021-08-21 DIAGNOSIS — F411 Generalized anxiety disorder: Secondary | ICD-10-CM | POA: Diagnosis not present

## 2021-08-21 MED ORDER — TIRZEPATIDE 2.5 MG/0.5ML ~~LOC~~ SOAJ: 2.5000 mg | SUBCUTANEOUS | 0 refills | Status: DC

## 2021-08-22 DIAGNOSIS — R000 Tachycardia, unspecified: Secondary | ICD-10-CM | POA: Diagnosis not present

## 2021-08-22 DIAGNOSIS — R0609 Other forms of dyspnea: Secondary | ICD-10-CM | POA: Diagnosis not present

## 2021-08-22 DIAGNOSIS — G4733 Obstructive sleep apnea (adult) (pediatric): Secondary | ICD-10-CM | POA: Diagnosis not present

## 2021-08-22 DIAGNOSIS — J4520 Mild intermittent asthma, uncomplicated: Secondary | ICD-10-CM | POA: Diagnosis not present

## 2021-08-27 DIAGNOSIS — M5127 Other intervertebral disc displacement, lumbosacral region: Secondary | ICD-10-CM | POA: Diagnosis not present

## 2021-09-04 ENCOUNTER — Telehealth: Payer: Self-pay

## 2021-09-04 NOTE — Telephone Encounter (Signed)
Sending note to support grp for appt and Christy CMA about med.

## 2021-09-04 NOTE — Telephone Encounter (Signed)
Appt cancelled

## 2021-09-04 NOTE — Telephone Encounter (Signed)
[facility] Night - Client Nonclinical Telephone Record AccessNurse Client [facility] Night - Client Client Site [facility] - Night Physician Gweneth Dimitri- MD Contact Type Call Who Is Calling Patient / Member / Family / Caregiver Caller Name [facility] Phone Number [phone] Patient Name Christine Valencia Patient DOB 01-24-1991 Call Type Message Only Information Provided Reason for Call Request to [facility] Appointment Initial Comment Caller states she wanted to cancel her appt. Patient request to speak to RN No Additional Comment Caller states the insurance denied the medication she was prescribed and her appt was for the follow up but she has not taking the medication yet. Disp. Time Disposition Final User 09/04/2021 6:35:41 AM General Information Provided Yes Darrel Hoover Call Closed By: Darrel Hoover Transaction Date/Time: 09/04/2021 6:31:18 AM (ET)

## 2021-09-06 ENCOUNTER — Ambulatory Visit: Payer: BC Managed Care – PPO | Admitting: Family Medicine

## 2021-09-07 DIAGNOSIS — M545 Low back pain, unspecified: Secondary | ICD-10-CM | POA: Diagnosis not present

## 2021-09-07 DIAGNOSIS — M6281 Muscle weakness (generalized): Secondary | ICD-10-CM | POA: Diagnosis not present

## 2021-09-13 DIAGNOSIS — F411 Generalized anxiety disorder: Secondary | ICD-10-CM | POA: Diagnosis not present

## 2021-09-13 DIAGNOSIS — F32A Depression, unspecified: Secondary | ICD-10-CM | POA: Diagnosis not present

## 2021-09-17 DIAGNOSIS — Z131 Encounter for screening for diabetes mellitus: Secondary | ICD-10-CM | POA: Diagnosis not present

## 2021-09-17 DIAGNOSIS — R0602 Shortness of breath: Secondary | ICD-10-CM | POA: Diagnosis not present

## 2021-09-17 DIAGNOSIS — R55 Syncope and collapse: Secondary | ICD-10-CM | POA: Diagnosis not present

## 2021-09-17 DIAGNOSIS — R000 Tachycardia, unspecified: Secondary | ICD-10-CM | POA: Diagnosis not present

## 2021-09-17 DIAGNOSIS — R0789 Other chest pain: Secondary | ICD-10-CM | POA: Diagnosis not present

## 2021-09-18 ENCOUNTER — Other Ambulatory Visit: Payer: Self-pay | Admitting: Student

## 2021-09-18 DIAGNOSIS — R55 Syncope and collapse: Secondary | ICD-10-CM

## 2021-09-18 DIAGNOSIS — R000 Tachycardia, unspecified: Secondary | ICD-10-CM

## 2021-09-18 DIAGNOSIS — R0602 Shortness of breath: Secondary | ICD-10-CM

## 2021-09-19 DIAGNOSIS — F411 Generalized anxiety disorder: Secondary | ICD-10-CM | POA: Diagnosis not present

## 2021-09-19 DIAGNOSIS — F32A Depression, unspecified: Secondary | ICD-10-CM | POA: Diagnosis not present

## 2021-09-21 DIAGNOSIS — F411 Generalized anxiety disorder: Secondary | ICD-10-CM | POA: Diagnosis not present

## 2021-09-21 DIAGNOSIS — F32A Depression, unspecified: Secondary | ICD-10-CM | POA: Diagnosis not present

## 2021-09-24 ENCOUNTER — Other Ambulatory Visit: Payer: Self-pay

## 2021-09-24 ENCOUNTER — Encounter
Admission: RE | Admit: 2021-09-24 | Discharge: 2021-09-24 | Disposition: A | Discharge status: Home / Self Care | Payer: BC Managed Care – PPO | Source: Ambulatory Visit | Attending: Student | Admitting: Student

## 2021-09-24 ENCOUNTER — Ambulatory Visit
Admission: RE | Admit: 2021-09-24 | Discharge: 2021-09-24 | Disposition: A | Discharge status: Home / Self Care | Payer: BC Managed Care – PPO | Source: Ambulatory Visit | Attending: Student | Admitting: Student

## 2021-09-24 DIAGNOSIS — I340 Nonrheumatic mitral (valve) insufficiency: Secondary | ICD-10-CM | POA: Insufficient documentation

## 2021-09-24 DIAGNOSIS — R000 Tachycardia, unspecified: Secondary | ICD-10-CM | POA: Insufficient documentation

## 2021-09-24 DIAGNOSIS — Z3202 Encounter for pregnancy test, result negative: Secondary | ICD-10-CM | POA: Diagnosis not present

## 2021-09-24 DIAGNOSIS — R0602 Shortness of breath: Secondary | ICD-10-CM

## 2021-09-24 DIAGNOSIS — R55 Syncope and collapse: Secondary | ICD-10-CM | POA: Diagnosis not present

## 2021-09-24 MED ORDER — TECHNETIUM TC 99M TETROFOSMIN IV KIT
30.0000 | PACK | Freq: Once | INTRAVENOUS | Status: AC | PRN
  Administered 2021-09-24: 29.9 via INTRAVENOUS

## 2021-09-24 MED ORDER — TECHNETIUM TC 99M TETROFOSMIN IV KIT: 10.0000 | PACK | Freq: Once | INTRAVENOUS | Status: DC | PRN

## 2021-09-24 NOTE — Progress Notes (Signed)
*  PRELIMINARY RESULTS* Echocardiogram 2D Echocardiogram has been performed.  Cristela Blue 09/24/2021, 9:14 AM

## 2021-09-25 ENCOUNTER — Encounter
Admission: RE | Admit: 2021-09-25 | Discharge: 2021-09-25 | Disposition: A | Discharge status: Home / Self Care | Payer: BC Managed Care – PPO | Source: Ambulatory Visit | Attending: Student | Admitting: Student

## 2021-09-25 MED ORDER — TECHNETIUM TC 99M TETROFOSMIN IV KIT
32.5400 | PACK | Freq: Once | INTRAVENOUS | Status: AC | PRN
  Administered 2021-09-25: 32.54 via INTRAVENOUS

## 2021-09-26 LAB — NM MYOCAR MULTI W/SPECT W/WALL MOTION / EF
Estimated workload: 1
Exercise duration (min): 1 min
Exercise duration (sec): 4 s
LV dias vol: 107 mL (ref 46–106)
LV sys vol: 45 mL
MPHR: 192 {beats}/min
Nuc Stress EF: 58 %
Peak HR: 122 {beats}/min
Percent HR: 63 %
Rest HR: 82 {beats}/min
Rest Nuclear Isotope Dose: 32.5 mCi
SDS: 0
SRS: 33
SSS: 18
ST Depression (mm): 0 mm
Stress Nuclear Isotope Dose: 29.9 mCi
TID: 0.76

## 2021-10-02 ENCOUNTER — Ambulatory Visit: Payer: BC Managed Care – PPO | Admitting: Family Medicine

## 2021-10-02 DIAGNOSIS — L301 Dyshidrosis [pompholyx]: Secondary | ICD-10-CM | POA: Diagnosis not present

## 2021-10-04 ENCOUNTER — Ambulatory Visit: Payer: BC Managed Care – PPO | Admitting: Nurse Practitioner

## 2021-10-04 ENCOUNTER — Other Ambulatory Visit: Payer: Self-pay

## 2021-10-04 VITALS — BP 135/92 | HR 81 | Temp 97.7°F | Resp 16 | Ht 67.0 in | Wt 380.0 lb

## 2021-10-04 DIAGNOSIS — D72829 Elevated white blood cell count, unspecified: Secondary | ICD-10-CM | POA: Diagnosis not present

## 2021-10-04 DIAGNOSIS — R7303 Prediabetes: Secondary | ICD-10-CM | POA: Diagnosis not present

## 2021-10-04 LAB — CBC WITH DIFFERENTIAL/PLATELET
Basophils Absolute: 0 10*3/uL (ref 0.0–0.1)
Basophils Relative: 0.5 % (ref 0.0–3.0)
Eosinophils Absolute: 0.2 10*3/uL (ref 0.0–0.7)
Eosinophils Relative: 2.8 % (ref 0.0–5.0)
HCT: 41.9 % (ref 36.0–46.0)
Hemoglobin: 14.1 g/dL (ref 12.0–15.0)
Lymphocytes Relative: 27.6 % (ref 12.0–46.0)
Lymphs Abs: 1.9 10*3/uL (ref 0.7–4.0)
MCHC: 33.7 g/dL (ref 30.0–36.0)
MCV: 84.6 fl (ref 78.0–100.0)
Monocytes Absolute: 0.4 10*3/uL (ref 0.1–1.0)
Monocytes Relative: 5.7 % (ref 3.0–12.0)
Neutro Abs: 4.4 10*3/uL (ref 1.4–7.7)
Neutrophils Relative %: 63.4 % (ref 43.0–77.0)
Platelets: 254 10*3/uL (ref 150.0–400.0)
RBC: 4.95 Mil/uL (ref 3.87–5.11)
RDW: 15 % (ref 11.5–15.5)
WBC: 7 10*3/uL (ref 4.0–10.5)

## 2021-10-04 MED ORDER — OZEMPIC (0.25 OR 0.5 MG/DOSE) 2 MG/1.5ML ~~LOC~~ SOPN: 0.2500 mg | PEN_INJECTOR | SUBCUTANEOUS | 0 refills | Status: DC

## 2021-10-04 NOTE — Assessment & Plan Note (Signed)
Patient had labs drawn on 09/17/2021.  With a modest white blood cell count of 12.1.  We did discuss reasons for elevated white blood cell count inclusive of steroid use, smoking and vaping, infection, stress.  Patient does use topical steroid but unlikely the reason for leukocytosis we will recheck CBC in office with differential to see which white blood cells are contributing.  Patient did mention she had burned some wood approximately 1 week prior to lab draw that she was allergic to.  Pending lab results

## 2021-10-04 NOTE — Assessment & Plan Note (Addendum)
Patient still dealing with her weight not able to exercise quite as much she is dealing with back issues which are being followed by [facility] neurological.  Did discuss diet.  Was going to try Ozempic but insurance would not cover her A1c now is changed to 5.7 which is in prediabetic range.  We will see if we get this approved through insurance and start to aid with weight loss.  Did discuss that his weight came down likelihood of getting pregnant Devinney increase and that we traditionally do not use this medication during pregnancy patient acknowledged.  We will start Ozempic.  States that she has never received Manjaro oh from the pharmacy.  Discussed not starting it and will try Ozempic again.

## 2021-10-04 NOTE — Progress Notes (Signed)
so  Established Patient Office Visit  Subjective:  Patient ID: Christine Valencia, female    DOB: 03/18/1991  Age: 30 y.o. MRN: [id number]  CC:  Chief Complaint  Patient presents with   Discuss lab results done by cardiologist    Elevated WBC and A1C-in epic system    HPI Christine Valencia presents for Lab discussion   A1C:  Was A1C 5.4  11 months ago and was 5.7 approx a week ago. States that she normally lunch and dinner. Sometimes breakfast. Currently dealing with back issues and followed by [facility]. Fluid intake is water, sparkling water, Doing a cup a day. Was walking 5-8 miles at work but unable now   WBC: Burned somewhat that she feels like she is allergic to approximately a week prior to having her labs drawn.  Patient states in the past she was septic and had a really high white count.  No current sick symptoms per report    Past Medical History:  Diagnosis Date   PCOS (polycystic ovarian syndrome)     Past Surgical History:  Procedure Laterality Date   BREAST BIOPSY     BREAST LUMPECTOMY  01/11/2011   KNEE SURGERY Right 2010    Family History  Problem Relation Age of Onset   Breast cancer Mother 43       BRCA negative   Melanoma Father     Social History   Socioeconomic History   Marital status: Married    Spouse name: Walter   Number of children: Not on file   Years of education: associates degree   Highest education level: Not on file  Occupational History   Not on file  Tobacco Use   Smoking status: Former    Years: 10.00    Types: Cigarettes    Quit date: 01/03/2020    Years since quitting: 1.7   Smokeless tobacco: Never  Vaping Use   Vaping Use: Never used  Substance and Sexual Activity   Alcohol use: Yes    Comment: a few times a month   Drug use: Never   Sexual activity: Yes    Birth control/protection: None  Other Topics Concern   Not on file  Social History Narrative   01/04/21   From: [place] originally, for jobs   Living: with husband,  Walter (2014) and friend John   Work: [employer] in [place]      Family: parents and sister in [place] - good relationship with sister/mom      Enjoys: shoot guns, ride 4 wheeler (helmet), fishing, hunting      Exercise: walking her large dogs, training dogs   Diet: not on a current diet, but planning to meal prep      Safety   Seat belts: Yes    Guns: Yes  and secure   Safe in relationships: Yes    Social Determinants of Health   Financial Resource Strain: Not on file  Food Insecurity: Not on file  Transportation Needs: Not on file  Physical Activity: Not on file  Stress: Not on file  Social Connections: Not on file  Intimate Partner Violence: Not on file    Outpatient Medications Prior to Visit  Medication Sig Dispense Refill   albuterol (VENTOLIN HFA) 108 (90 Base) MCG/ACT inhaler Inhale 2 puffs into the lungs every 6 (six) hours as needed for wheezing or shortness of breath. 8 g 0   betamethasone dipropionate 0.05 % cream Apply topically 2 (two) times daily. For no more   tan 10 days 30 g 1   Cholecalciferol (VITAMIN D3) 1.25 MG (50000 UT) TABS Take 1 tablet by mouth every 7 (seven) days. 12 tablet 1   clobetasol cream (TEMOVATE) 0.05 % 1(ONE) APPLICATION(S) TOPICAL 2(TWO) TIMES A DAY     famotidine (PEPCID) 10 MG tablet Take 10 mg by mouth daily.     fluticasone furoate-vilanterol (BREO ELLIPTA) 100-25 MCG/INH AEPB Inhale 1 puff into the lungs daily.     gabapentin (NEURONTIN) 300 MG capsule Take 1 capsule by mouth daily.     hydrOXYzine (ATARAX/VISTARIL) 25 MG tablet Take 0.5-1 tablets (12.5-25 mg total) by mouth every 6 (six) hours as needed for anxiety. 180 tablet 1   loratadine (CLARITIN) 10 MG tablet Take 10 mg by mouth daily.     OVER THE COUNTER MEDICATION Take 3 tablets by mouth 2 (two) times daily. Standard Process [employer]- multi vitamin     OVER THE COUNTER MEDICATION Take 3 tablets by mouth every hour as needed (anxiety). Standard Process Drenamin (supplement  for anxiety)     sertraline (ZOLOFT) 100 MG tablet Take 100 mg by mouth daily.     tirzepatide (MOUNJARO) 2.5 MG/0.5ML Pen Inject 2.5 mg into the skin once a week. (Patient not taking: Reported on 10/04/2021) 2 mL 0   escitalopram (LEXAPRO) 20 MG tablet Take 1 tablet (20 mg total) by mouth daily. 90 tablet 1   No facility-administered medications prior to visit.    Allergies  Allergen Reactions   Ambrosia Artemisiifolia (Ragweed) Skin Test Other (See Comments)   Dog Epithelium Allergy Skin Test Hives and Itching   Selective Estrogen Receptor Modulators Itching and Other (See Comments)    Reports "it feels like my body is on fire"   Vaccinium Angustifolium Hives   Bupropion Nausea And Vomiting    ROS Review of Systems  Constitutional:  Negative for chills and fever.  HENT:  Negative for congestion, ear pain, rhinorrhea, sinus pressure, sinus pain and sore throat.   Respiratory:  Negative for cough and shortness of breath.   Cardiovascular:  Negative for chest pain.  Genitourinary:  Positive for menstrual problem.  Neurological:  Negative for headaches.     Objective:    Physical Exam Vitals and nursing note reviewed.  Constitutional:      Appearance: She is obese.  HENT:     Right Ear: Tympanic membrane, ear canal and external ear normal.     Left Ear: Tympanic membrane, ear canal and external ear normal.     Mouth/Throat:     Mouth: Mucous membranes are moist.     Pharynx: Oropharynx is clear. No oropharyngeal exudate or posterior oropharyngeal erythema.  Eyes:     Extraocular Movements: Extraocular movements intact.     Pupils: Pupils are equal, round, and reactive to light.  Cardiovascular:     Rate and Rhythm: Normal rate and regular rhythm.  Pulmonary:     Effort: Pulmonary effort is normal.     Breath sounds: Normal breath sounds.  Abdominal:     General: Bowel sounds are normal.  Lymphadenopathy:     Cervical: No cervical adenopathy.  Neurological:     Mental  Status: She is alert.  Psychiatric:        Mood and Affect: Mood normal.        Behavior: Behavior normal.        Thought Content: Thought content normal.        Judgment: Judgment normal.    BP (!) 135/92     Pulse 81   Temp 97.7 F (36.5 C)   Resp 16   Ht 5' 7" (1.702 m)   Wt (!) 380 lb (172.4 kg)   LMP 08/06/2021 Comment: has PCOS and has irregarular periods  SpO2 97%   BMI 59.52 kg/m  Wt Readings from Last 3 Encounters:  10/04/21 (!) 380 lb (172.4 kg)  06/06/21 (!) 362 lb 12 oz (164.5 kg)  04/03/21 (!) 358 lb 4 oz (162.5 kg)     Health Maintenance Due  Topic Date Due   Pneumococcal Vaccine 19-64 Years old (1 - PCV) Never done   TETANUS/TDAP  Never done    There are no preventive care reminders to display for this patient.  Lab Results  Component Value Date   TSH 0.336 (L) 08/30/2020   Lab Results  Component Value Date   WBC 8.6 11/01/2020   HGB 14.1 11/01/2020   HCT 41.2 11/01/2020   MCV 86.5 11/01/2020   PLT 274.0 11/01/2020   Lab Results  Component Value Date   NA 137 11/01/2020   K 4.0 11/01/2020   CO2 28 11/01/2020   GLUCOSE 100 (H) 11/01/2020   BUN 13 11/01/2020   CREATININE 0.84 11/01/2020   BILITOT 0.4 11/01/2020   ALKPHOS 72 11/01/2020   AST 13 11/01/2020   ALT 15 11/01/2020   PROT 7.0 11/01/2020   ALBUMIN 4.2 11/01/2020   CALCIUM 9.3 11/01/2020   ANIONGAP 9 09/01/2020   GFR 94.97 11/01/2020   Lab Results  Component Value Date   CHOL 203 (H) 11/01/2020   Lab Results  Component Value Date   HDL 39.90 11/01/2020   Lab Results  Component Value Date   LDLCALC 129 (H) 11/01/2020   Lab Results  Component Value Date   TRIG 168.0 (H) 11/01/2020   Lab Results  Component Value Date   CHOLHDL 5 11/01/2020   Lab Results  Component Value Date   HGBA1C 5.4 11/01/2020      Assessment & Plan:   Problem List Items Addressed This Visit       Other   Morbid obesity (HCC)    Patient still dealing with her weight not able to  exercise quite as much she is dealing with back issues which are being followed by [facility].  Did discuss diet.  Was going to try Ozempic but insurance would not cover her A1c now is changed to 5.7 which is in prediabetic range.  We will see if we get this approved through insurance and start to aid with weight loss.  Did discuss that his weight came down likelihood of getting pregnant Manalo increase and that we traditionally do not use this medication during pregnancy patient acknowledged.  We will start Ozempic.  States that she has never received Manjaro oh from the pharmacy.  Discussed not starting it and will try Ozempic again.      Relevant Medications   Semaglutide,0.25 or 0.5MG/DOS, (OZEMPIC, 0.25 OR 0.5 MG/DOSE,) 2 MG/1.5ML SOPN   Leukocytosis - Primary    Patient had labs drawn on 09/17/2021.  With a modest white blood cell count of 12.1.  We did discuss reasons for elevated white blood cell count inclusive of steroid use, smoking and vaping, infection, stress.  Patient does use topical steroid but unlikely the reason for leukocytosis we will recheck CBC in office with differential to see which white blood cells are contributing.  Patient did mention she had burned some wood approximately 1 week prior to lab draw that she   was allergic to.  Pending lab results      Relevant Orders   CBC with Differential/Platelet (Completed)   Other Visit Diagnoses     Prediabetes       Relevant Medications   Semaglutide,0.25 or 0.5MG/DOS, (OZEMPIC, 0.25 OR 0.5 MG/DOSE,) 2 MG/1.5ML SOPN       No orders of the defined types were placed in this encounter.   Follow-up: No follow-ups on file.   This visit occurred during the SARS-CoV-2 public health emergency.  Safety protocols were in place, including screening questions prior to the visit, additional usage of staff PPE, and extensive cleaning of exam room while observing appropriate contact time as indicated for disinfecting solutions.    Matt Cable, NP 

## 2021-10-04 NOTE — Patient Instructions (Signed)
Nice to see you today Will be in touch with the labs Will see about getting the ozempic started or the saxenda

## 2021-10-05 ENCOUNTER — Telehealth: Payer: Self-pay

## 2021-10-05 DIAGNOSIS — F32A Depression, unspecified: Secondary | ICD-10-CM | POA: Diagnosis not present

## 2021-10-05 DIAGNOSIS — F411 Generalized anxiety disorder: Secondary | ICD-10-CM | POA: Diagnosis not present

## 2021-10-05 NOTE — Telephone Encounter (Signed)
PA for Ozempic submitted through covermymeds. Awaiting response.   Key: [id number] - PA Case ID: [id number] - Rx #: [id number]

## 2021-10-08 DIAGNOSIS — M5127 Other intervertebral disc displacement, lumbosacral region: Secondary | ICD-10-CM | POA: Diagnosis not present

## 2021-10-08 DIAGNOSIS — M5416 Radiculopathy, lumbar region: Secondary | ICD-10-CM | POA: Diagnosis not present

## 2021-10-09 NOTE — Telephone Encounter (Signed)
PA still pending with insurance.

## 2021-10-12 NOTE — Telephone Encounter (Signed)
Called insurance/[employer] and PA is still pending and they have until 10/20/21 to respond. Called patient and updated her on this.

## 2021-10-16 DIAGNOSIS — Z01818 Encounter for other preprocedural examination: Secondary | ICD-10-CM | POA: Diagnosis not present

## 2021-10-16 DIAGNOSIS — G4733 Obstructive sleep apnea (adult) (pediatric): Secondary | ICD-10-CM | POA: Diagnosis not present

## 2021-10-16 DIAGNOSIS — J4520 Mild intermittent asthma, uncomplicated: Secondary | ICD-10-CM | POA: Diagnosis not present

## 2021-10-17 DIAGNOSIS — M5127 Other intervertebral disc displacement, lumbosacral region: Secondary | ICD-10-CM | POA: Diagnosis not present

## 2021-10-17 DIAGNOSIS — Z01818 Encounter for other preprocedural examination: Secondary | ICD-10-CM | POA: Diagnosis not present

## 2021-10-19 NOTE — Telephone Encounter (Signed)
PA was denied as of today. Patient advised. We can try appeal. Patient stated Matt mentioned Saxenda. I advised patient it Tosh be denied also but would let Matt know and will update patient as I have more infromation

## 2021-10-22 ENCOUNTER — Other Ambulatory Visit: Payer: Self-pay | Admitting: Nurse Practitioner

## 2021-10-22 MED ORDER — SAXENDA 18 MG/3ML ~~LOC~~ SOPN: PEN_INJECTOR | SUBCUTANEOUS | 0 refills | Status: DC

## 2021-10-22 NOTE — Telephone Encounter (Signed)
I did send in a rx for saxenda. According to the computer the medication will not be covered

## 2021-10-22 NOTE — Telephone Encounter (Signed)
PA approved Case [id number]; Status:Approved;Review Type:Prior Auth;Coverage Start Date:09/22/2021;Coverage End Date:02/19/2022. Patient advised and pharmacy advised via fax

## 2021-10-22 NOTE — Telephone Encounter (Signed)
Patient advised. Will complete PA and patient is aware I will call her back once I have information from the insurance

## 2021-10-30 DIAGNOSIS — J45909 Unspecified asthma, uncomplicated: Secondary | ICD-10-CM | POA: Diagnosis not present

## 2021-10-30 DIAGNOSIS — Z7951 Long term (current) use of inhaled steroids: Secondary | ICD-10-CM | POA: Diagnosis not present

## 2021-10-30 DIAGNOSIS — F419 Anxiety disorder, unspecified: Secondary | ICD-10-CM | POA: Diagnosis not present

## 2021-10-30 DIAGNOSIS — F32A Depression, unspecified: Secondary | ICD-10-CM | POA: Diagnosis not present

## 2021-10-30 DIAGNOSIS — R52 Pain, unspecified: Secondary | ICD-10-CM | POA: Diagnosis not present

## 2021-10-30 DIAGNOSIS — Z79899 Other long term (current) drug therapy: Secondary | ICD-10-CM | POA: Diagnosis not present

## 2021-10-30 DIAGNOSIS — M5127 Other intervertebral disc displacement, lumbosacral region: Secondary | ICD-10-CM | POA: Diagnosis not present

## 2021-10-30 DIAGNOSIS — M5117 Intervertebral disc disorders with radiculopathy, lumbosacral region: Secondary | ICD-10-CM | POA: Diagnosis not present

## 2021-11-05 DIAGNOSIS — F411 Generalized anxiety disorder: Secondary | ICD-10-CM | POA: Diagnosis not present

## 2021-11-05 DIAGNOSIS — F32A Depression, unspecified: Secondary | ICD-10-CM | POA: Diagnosis not present

## 2021-11-12 ENCOUNTER — Encounter: Payer: Self-pay | Admitting: Nurse Practitioner

## 2021-11-15 DIAGNOSIS — F411 Generalized anxiety disorder: Secondary | ICD-10-CM | POA: Diagnosis not present

## 2021-11-15 DIAGNOSIS — F32A Depression, unspecified: Secondary | ICD-10-CM | POA: Diagnosis not present

## 2021-11-20 DIAGNOSIS — I495 Sick sinus syndrome: Secondary | ICD-10-CM | POA: Diagnosis not present

## 2021-11-29 MED ORDER — SAXENDA 18 MG/3ML ~~LOC~~ SOPN: PEN_INJECTOR | SUBCUTANEOUS | 1 refills | Status: AC

## 2021-11-29 NOTE — Addendum Note (Signed)
Addended by: Eustaquio Boyden on: 11/29/2021 01:14 PM   Modules accepted: Orders

## 2021-12-05 DIAGNOSIS — F411 Generalized anxiety disorder: Secondary | ICD-10-CM | POA: Diagnosis not present

## 2021-12-05 DIAGNOSIS — F32A Depression, unspecified: Secondary | ICD-10-CM | POA: Diagnosis not present

## 2021-12-18 DIAGNOSIS — F411 Generalized anxiety disorder: Secondary | ICD-10-CM | POA: Diagnosis not present

## 2021-12-18 DIAGNOSIS — F32A Depression, unspecified: Secondary | ICD-10-CM | POA: Diagnosis not present

## 2021-12-24 DIAGNOSIS — R0609 Other forms of dyspnea: Secondary | ICD-10-CM | POA: Diagnosis not present

## 2021-12-24 DIAGNOSIS — J4520 Mild intermittent asthma, uncomplicated: Secondary | ICD-10-CM | POA: Diagnosis not present

## 2021-12-24 DIAGNOSIS — G4733 Obstructive sleep apnea (adult) (pediatric): Secondary | ICD-10-CM | POA: Diagnosis not present

## 2022-01-02 DIAGNOSIS — F411 Generalized anxiety disorder: Secondary | ICD-10-CM | POA: Diagnosis not present

## 2022-01-02 DIAGNOSIS — F32A Depression, unspecified: Secondary | ICD-10-CM | POA: Diagnosis not present

## 2022-01-16 DIAGNOSIS — F411 Generalized anxiety disorder: Secondary | ICD-10-CM | POA: Diagnosis not present

## 2022-01-16 DIAGNOSIS — F32A Depression, unspecified: Secondary | ICD-10-CM | POA: Diagnosis not present

## 2022-01-23 DIAGNOSIS — F4312 Post-traumatic stress disorder, chronic: Secondary | ICD-10-CM | POA: Diagnosis not present

## 2022-01-23 DIAGNOSIS — F32A Depression, unspecified: Secondary | ICD-10-CM | POA: Diagnosis not present

## 2022-01-23 DIAGNOSIS — F411 Generalized anxiety disorder: Secondary | ICD-10-CM | POA: Diagnosis not present

## 2022-02-15 DIAGNOSIS — F32A Depression, unspecified: Secondary | ICD-10-CM | POA: Diagnosis not present

## 2022-02-15 DIAGNOSIS — F411 Generalized anxiety disorder: Secondary | ICD-10-CM | POA: Diagnosis not present

## 2022-02-15 DIAGNOSIS — F4312 Post-traumatic stress disorder, chronic: Secondary | ICD-10-CM | POA: Diagnosis not present

## 2022-02-15 IMAGING — CR DG CHEST 2V
2 series · 2 of 2 positions shown · non-contrast
Comparison: None.

CLINICAL DATA: Diagnosed with sinus infection on [REDACTED]. Finished
antibiotics today but now feeling short of breath. COVID negative.

EXAM:
CHEST - 2 VIEW

[chest pa]
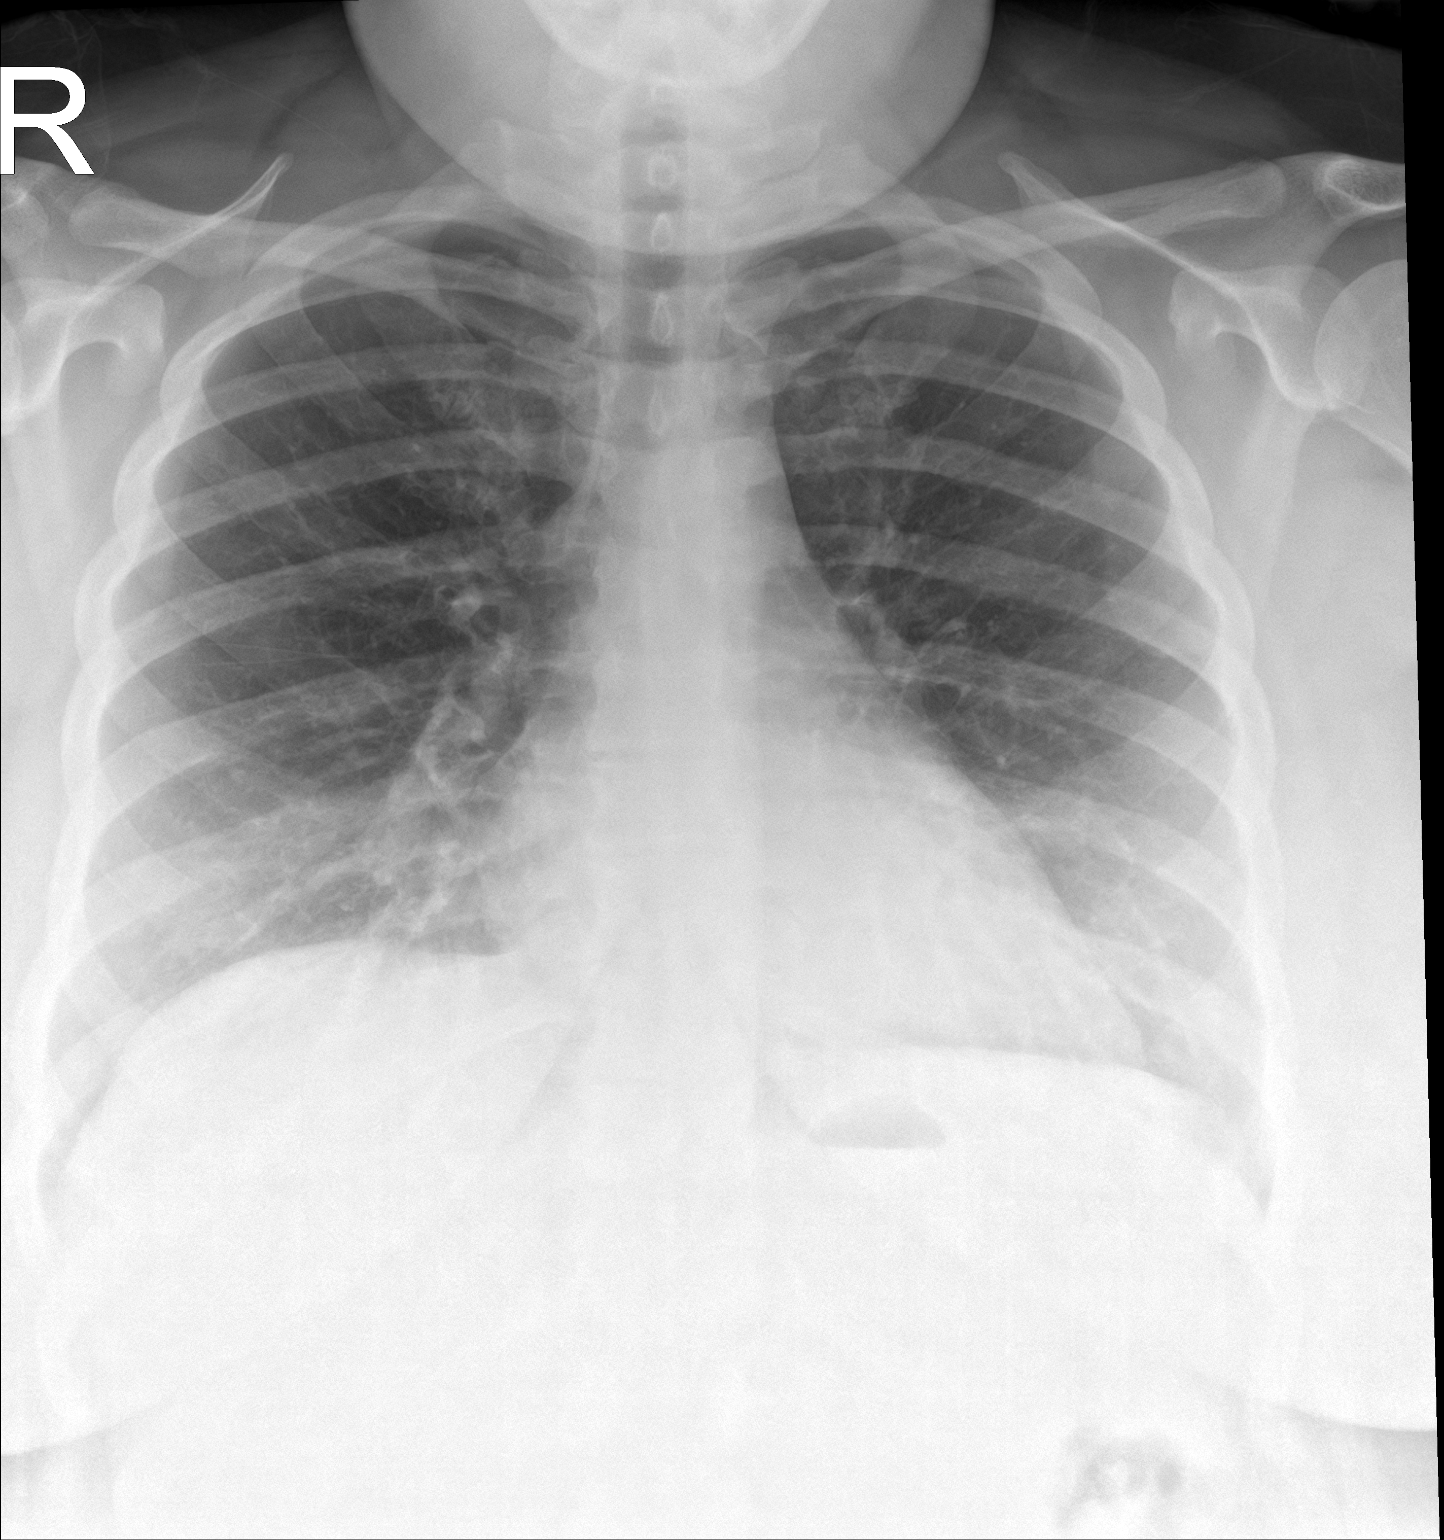

[chest lat]
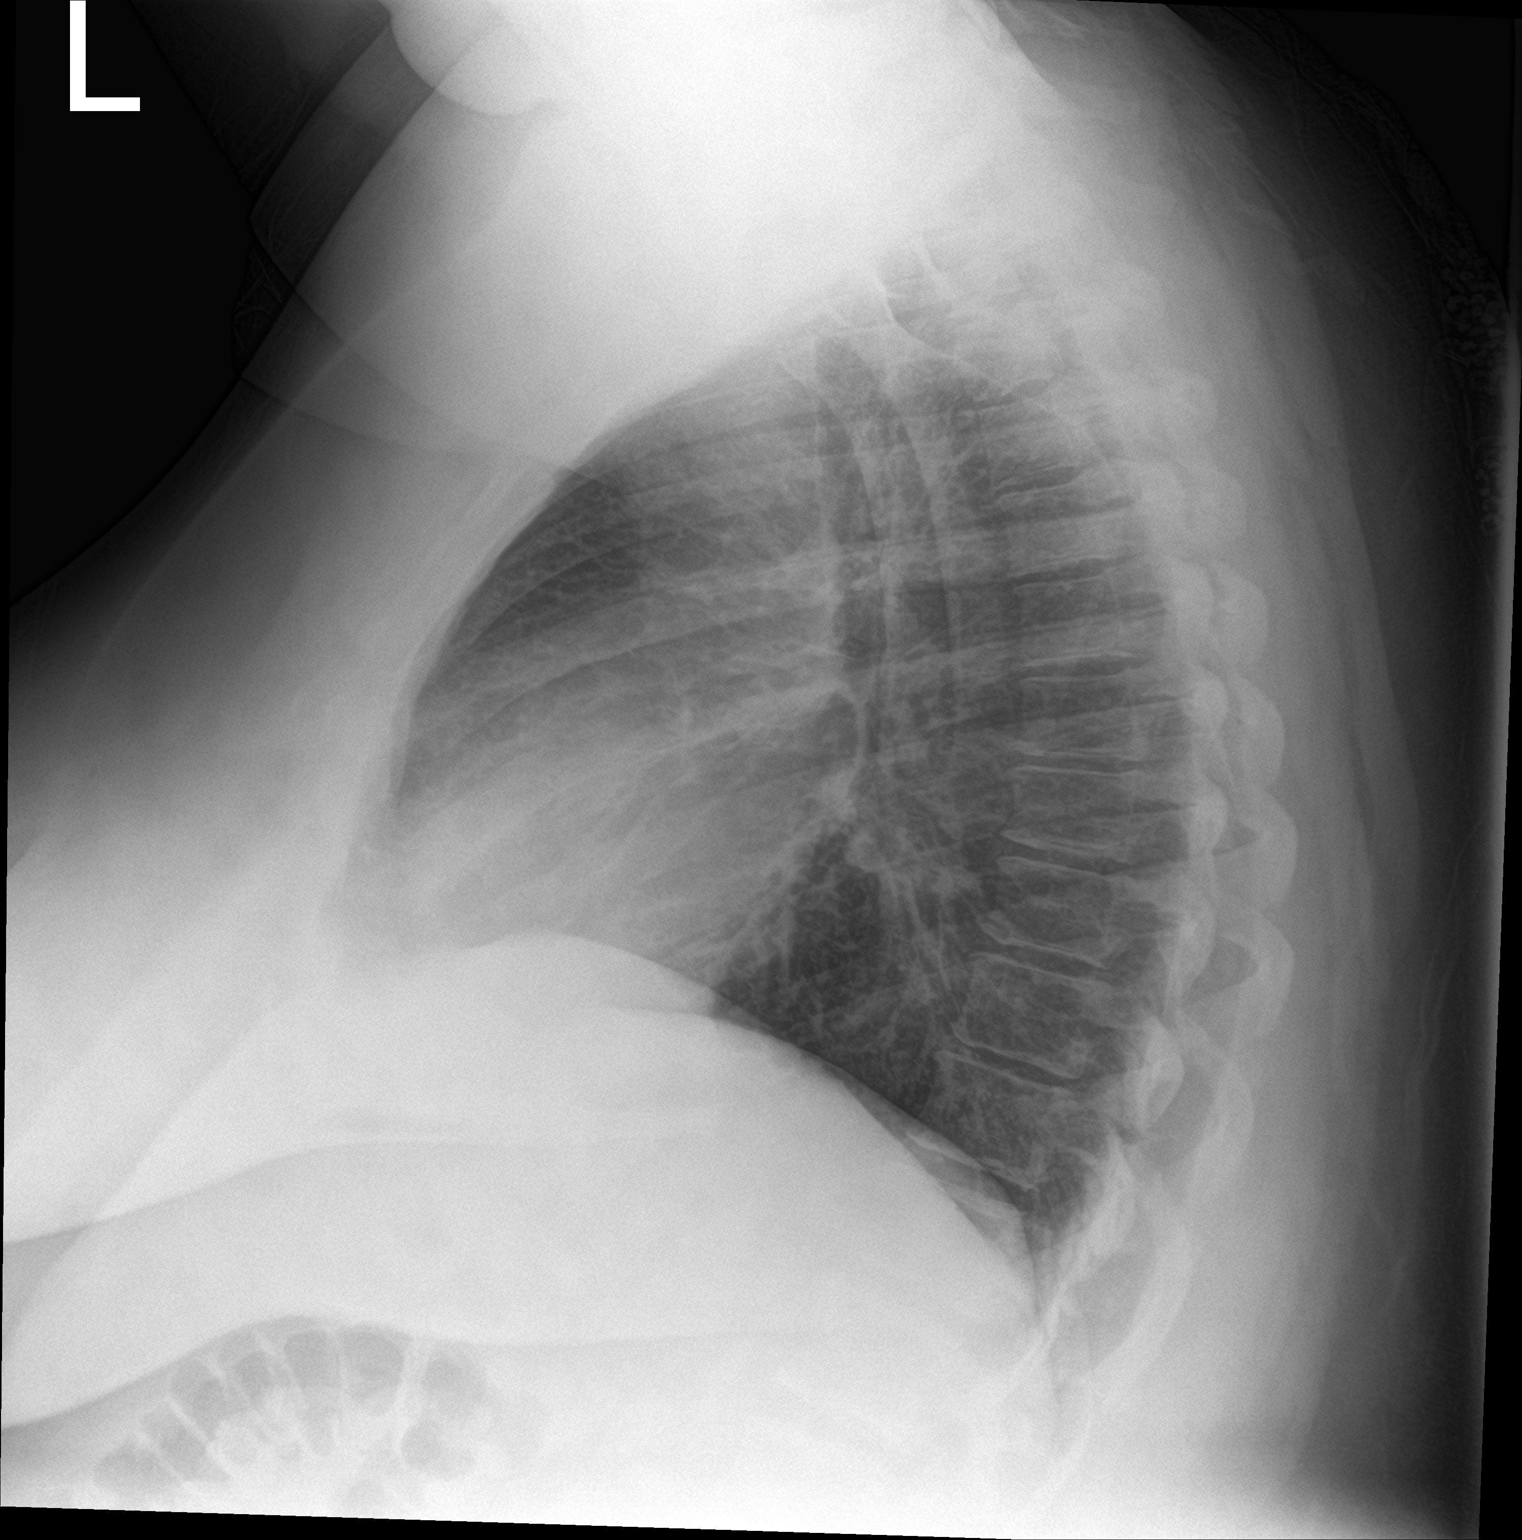

[2 of 2 positions shown; findings below may reference images not displayed]

FINDINGS: The heart size and mediastinal contours are within normal limits.
Both lungs are clear. The visualized skeletal structures are
unremarkable.
IMPRESSION: No active cardiopulmonary disease.

## 2022-02-18 ENCOUNTER — Other Ambulatory Visit: Payer: Self-pay

## 2022-03-05 ENCOUNTER — Ambulatory Visit: Payer: BC Managed Care – PPO | Admitting: Family Medicine

## 2022-03-11 ENCOUNTER — Ambulatory Visit: Payer: BC Managed Care – PPO | Admitting: Family Medicine

## 2022-05-13 DIAGNOSIS — F32A Depression, unspecified: Secondary | ICD-10-CM | POA: Diagnosis not present

## 2022-05-13 DIAGNOSIS — F411 Generalized anxiety disorder: Secondary | ICD-10-CM | POA: Diagnosis not present

## 2022-05-13 DIAGNOSIS — F4312 Post-traumatic stress disorder, chronic: Secondary | ICD-10-CM | POA: Diagnosis not present

## 2022-06-20 DIAGNOSIS — F411 Generalized anxiety disorder: Secondary | ICD-10-CM | POA: Diagnosis not present

## 2022-06-20 DIAGNOSIS — F32A Depression, unspecified: Secondary | ICD-10-CM | POA: Diagnosis not present

## 2022-06-20 DIAGNOSIS — F4312 Post-traumatic stress disorder, chronic: Secondary | ICD-10-CM | POA: Diagnosis not present

## 2022-09-11 DIAGNOSIS — R55 Syncope and collapse: Secondary | ICD-10-CM | POA: Diagnosis not present

## 2022-09-11 DIAGNOSIS — J45909 Unspecified asthma, uncomplicated: Secondary | ICD-10-CM | POA: Diagnosis not present

## 2022-09-11 DIAGNOSIS — G90A Postural orthostatic tachycardia syndrome (POTS): Secondary | ICD-10-CM | POA: Diagnosis not present

## 2022-09-11 DIAGNOSIS — I495 Sick sinus syndrome: Secondary | ICD-10-CM | POA: Diagnosis not present

## 2022-09-17 DIAGNOSIS — R000 Tachycardia, unspecified: Secondary | ICD-10-CM | POA: Diagnosis not present

## 2022-09-20 DIAGNOSIS — R000 Tachycardia, unspecified: Secondary | ICD-10-CM | POA: Diagnosis not present

## 2022-11-11 DIAGNOSIS — Z0001 Encounter for general adult medical examination with abnormal findings: Secondary | ICD-10-CM | POA: Diagnosis not present

## 2022-11-11 DIAGNOSIS — R635 Abnormal weight gain: Secondary | ICD-10-CM | POA: Diagnosis not present

## 2022-11-11 DIAGNOSIS — L680 Hirsutism: Secondary | ICD-10-CM | POA: Diagnosis not present

## 2022-11-12 DIAGNOSIS — I4711 Inappropriate sinus tachycardia, so stated: Secondary | ICD-10-CM | POA: Diagnosis not present

## 2022-11-12 DIAGNOSIS — G90A Postural orthostatic tachycardia syndrome (POTS): Secondary | ICD-10-CM | POA: Diagnosis not present

## 2022-11-13 DIAGNOSIS — I4711 Inappropriate sinus tachycardia, so stated: Secondary | ICD-10-CM | POA: Diagnosis not present

## 2023-12-21 ENCOUNTER — Ambulatory Visit (INDEPENDENT_AMBULATORY_CARE_PROVIDER_SITE_OTHER): Payer: BC Managed Care – PPO

## 2023-12-21 ENCOUNTER — Ambulatory Visit
Admission: EM | Admit: 2023-12-21 | Discharge: 2023-12-21 | Disposition: A | Discharge status: Home / Self Care | Payer: BC Managed Care – PPO | Attending: Physician Assistant | Admitting: Physician Assistant

## 2023-12-21 DIAGNOSIS — R0602 Shortness of breath: Secondary | ICD-10-CM | POA: Insufficient documentation

## 2023-12-21 DIAGNOSIS — R0789 Other chest pain: Secondary | ICD-10-CM | POA: Insufficient documentation

## 2023-12-21 DIAGNOSIS — R82998 Other abnormal findings in urine: Secondary | ICD-10-CM | POA: Diagnosis not present

## 2023-12-21 DIAGNOSIS — Z01810 Encounter for preprocedural cardiovascular examination: Secondary | ICD-10-CM | POA: Diagnosis not present

## 2023-12-21 DIAGNOSIS — J45901 Unspecified asthma with (acute) exacerbation: Secondary | ICD-10-CM | POA: Diagnosis not present

## 2023-12-21 DIAGNOSIS — R5383 Other fatigue: Secondary | ICD-10-CM | POA: Insufficient documentation

## 2023-12-21 LAB — COMPREHENSIVE METABOLIC PANEL
ALT: 19 U/L (ref 0–44)
AST: 17 U/L (ref 15–41)
Albumin: 3.7 g/dL (ref 3.5–5.0)
Alkaline Phosphatase: 71 U/L (ref 38–126)
Anion gap: 7 (ref 5–15)
BUN: 14 mg/dL (ref 6–20)
CO2: 25 mmol/L (ref 22–32)
Calcium: 8.9 mg/dL (ref 8.9–10.3)
Chloride: 105 mmol/L (ref 98–111)
Creatinine, Ser: 0.88 mg/dL (ref 0.44–1.00)
GFR, Estimated: >60 mL/min (ref >60)
Glucose, Bld: 93 mg/dL (ref 70–99)
Potassium: 4.1 mmol/L (ref 3.5–5.1)
Sodium: 137 mmol/L (ref 135–145)
Total Bilirubin: 0.7 mg/dL (ref 0.0–1.2)
Total Protein: 7.4 g/dL (ref 6.5–8.1)

## 2023-12-21 LAB — CBC WITH DIFFERENTIAL/PLATELET
Abs Immature Granulocytes: 0.02 10*3/uL (ref 0.00–0.07)
Basophils Absolute: 0 10*3/uL (ref 0.0–0.1)
Basophils Relative: 0 %
Eosinophils Absolute: 0.2 10*3/uL (ref 0.0–0.5)
Eosinophils Relative: 3 %
HCT: 41.7 % (ref 36.0–46.0)
Hemoglobin: 14.1 g/dL (ref 12.0–15.0)
Immature Granulocytes: 0 %
Lymphocytes Relative: 27 %
Lymphs Abs: 2.6 10*3/uL (ref 0.7–4.0)
MCH: 28.1 pg (ref 26.0–34.0)
MCHC: 33.8 g/dL (ref 30.0–36.0)
MCV: 83.1 fL (ref 80.0–100.0)
Monocytes Absolute: 0.4 10*3/uL (ref 0.1–1.0)
Monocytes Relative: 5 %
Neutro Abs: 6.4 10*3/uL (ref 1.7–7.7)
Neutrophils Relative %: 65 %
Platelets: 278 10*3/uL (ref 150–400)
RBC: 5.02 MIL/uL (ref 3.87–5.11)
RDW: 14.4 % (ref 11.5–15.5)
WBC: 9.7 10*3/uL (ref 4.0–10.5)
nRBC: 0 % (ref 0.0–0.2)

## 2023-12-21 LAB — URINALYSIS, W/ REFLEX TO CULTURE (INFECTION SUSPECTED)
Bilirubin Urine: NEGATIVE
Glucose, UA: NEGATIVE mg/dL
Hgb urine dipstick: NEGATIVE
Ketones, ur: NEGATIVE mg/dL
Leukocytes,Ua: NEGATIVE
Nitrite: NEGATIVE
Protein, ur: NEGATIVE mg/dL
Specific Gravity, Urine: 1.025 (ref 1.005–1.030)
pH: 6 (ref 5.0–8.0)

## 2023-12-21 LAB — GROUP A STREP BY PCR: Group A Strep by PCR: NOT DETECTED

## 2023-12-21 MED ORDER — PREDNISONE 10 MG PO TABS: ORAL_TABLET | ORAL | 0 refills | Status: AC

## 2023-12-21 MED ORDER — ALBUTEROL SULFATE (2.5 MG/3ML) 0.083% IN NEBU
2.5000 mg | INHALATION_SOLUTION | Freq: Once | RESPIRATORY_TRACT | Status: AC
  Administered 2023-12-21: 2.5 mg via RESPIRATORY_TRACT

## 2023-12-21 MED ORDER — ALBUTEROL SULFATE HFA 108 (90 BASE) MCG/ACT IN AERS: 1.0000 | INHALATION_SPRAY | Freq: Four times a day (QID) | RESPIRATORY_TRACT | 0 refills | Status: AC | PRN

## 2023-12-21 NOTE — ED Triage Notes (Addendum)
Pt c/o fatigue, SOB, dizziness, reduced urine output, dark urine Temperature of 100.4 x10days  Pt states that [facility]  Pt states that she missed 9 days of work from symptoms  Pt was hospitalized in 2021 for sepsis and lactic acidosis

## 2023-12-21 NOTE — ED Provider Notes (Signed)
[facility]    CSN: [id number] Arrival date & time: 12/21/23  1228      History   Chief Complaint Chief Complaint  Patient presents with   Urinary Tract Infection    HPI Christine Valencia is a 33 y.o. female presenting for fatigue, body aches, x 7-10 days. Denies cough. Slight congestion "on the left side." Coughs 1-2 times per day and it is dry. Reports chest and throat tightness with shortness of breath x 1 week. Has history of asthma. Inhaler has not helped.  Seen at [facility] 6 days ago  for fatigue, tonsil swelling/sore throat and fever. She was negative for COVID and Flu.   Patient reports continued fatigue. Fever has resolved. Current temp is 98.6 degrees.  Reports dark urine with urgency. Denies dysuria. Has been drinking "a gallon of water per day." Has had mild lower abdominal pain that is "just annoying." No nausea/vomiting or constipation. Says 'it does not feel like a cyst rupture. I've been down that road."  No vaginal discharge and non concern for pregnancy. No concern for STI.   History of dysautonomia, PCOS, asthma, migraines, anxiety, obesity (BMI 61.87) eczema and depression.   HPI  Past Medical History:  Diagnosis Date   PCOS (polycystic ovarian syndrome)     Patient Active Problem List   Diagnosis Date Noted   Leukocytosis 10/04/2021   Nausea and vomiting 04/03/2021   Seasonal allergies 04/03/2021   Major depressive disorder, recurrent episode, moderate (HCC) 01/04/2021   Generalized anxiety disorder 01/04/2021   Migraines 01/04/2021   Family history of melanoma 01/04/2021   Morbid obesity (HCC) 11/05/2020   Anxiety and depression 11/05/2020   Eczema 11/05/2020   Asthma 09/26/2020   Acute bronchitis and bronchiolitis 08/27/2020   PCOS (polycystic ovarian syndrome) 03/01/2013    Past Surgical History:  Procedure Laterality Date   BREAST BIOPSY     BREAST LUMPECTOMY  01/11/2011   KNEE SURGERY Right 2010    OB History   No  obstetric history on file.      Home Medications    Prior to Admission medications   Medication Sig Start Date End Date Taking? Authorizing Provider  albuterol (VENTOLIN HFA) 108 (90 Base) MCG/ACT inhaler Inhale 2 puffs into the lungs every 6 (six) hours as needed for wheezing or shortness of breath. 09/01/20  Yes Lynn Ito, MD  albuterol (VENTOLIN HFA) 108 (90 Base) MCG/ACT inhaler Inhale 1-2 puffs into the lungs every 6 (six) hours as needed for wheezing or shortness of breath. 12/21/23  Yes Eusebio Friendly B, PA-C  ascorbic acid (VITAMIN C) 100 MG tablet Take by mouth.   Yes [provider]  Cholecalciferol (VITAMIN D3) 1.25 MG (50000 UT) TABS Take 1 tablet by mouth every 7 (seven) days. 11/05/20  Yes Emi Belfast, FNP  fluticasone furoate-vilanterol (BREO ELLIPTA) 100-25 MCG/INH AEPB Inhale 1 puff into the lungs daily. 09/27/20  Yes [provider]  guaiFENesin (MUCINEX) 600 MG 12 hr tablet Take by mouth. 12/17/23 12/24/23 Yes [provider]  hydrOXYzine (ATARAX/VISTARIL) 25 MG tablet Take 0.5-1 tablets (12.5-25 mg total) by mouth every 6 (six) hours as needed for anxiety. 04/03/21  Yes Gweneth Dimitri, MD  liraglutide (VICTOZA) 18 MG/3ML SOPN Inject 3 mg into the skin daily at 12 noon.   Yes [provider]  lisdexamfetamine (VYVANSE) 40 MG capsule TAKE 1 CAPSULE BY MOUTH EVERY DAY IN THE MORNING FOR 30 DAYS 11/25/23  Yes [provider]  loratadine (CLARITIN) 10 MG  tablet Take 10 mg by mouth daily.   Yes [provider]  metFORMIN (GLUCOPHAGE-XR) 500 MG 24 hr tablet Take 500 mg by mouth 2 (two) times daily. 11/25/23  Yes [provider]  metoprolol succinate (TOPROL-XL) 25 MG 24 hr tablet Take 1 tablet by mouth daily. 10/04/23  Yes [provider]  metoprolol succinate (TOPROL-XL) 50 MG 24 hr tablet TAKE 1 TABLET (50 MG TOTAL) BY MOUTH TWO (2) TIMES A DAY. 02/11/23  Yes [provider]  NURTEC 75 MG TBDP 1  TABLET ON THE TONGUE AND ALLOW TO DISSOLVE ORALLY EVERY OTHER DAY 30 DAYS 11/27/23  Yes [provider]  ondansetron (ZOFRAN-ODT) 8 MG disintegrating tablet DISSOLVE 1 TABLET ON THE TONGUE BY MOUTH EVERY DAY AS NEEDED 11/25/23  Yes [provider]  OVER THE COUNTER MEDICATION Take 3 tablets by mouth 2 (two) times daily. Standard Process Catalyn- multi vitamin   Yes [provider]  OVER THE COUNTER MEDICATION Take 3 tablets by mouth every hour as needed (anxiety). Standard Process Drenamin (supplement for anxiety)   Yes [provider]  predniSONE (DELTASONE) 10 MG tablet Take 6 tabs p.o. on day 1 and decrease by 1 tablet daily until complete 12/21/23  Yes Eusebio Friendly B, PA-C  sertraline (ZOLOFT) 100 MG tablet Take 100 mg by mouth daily. 09/19/21  Yes [provider]  spironolactone (ALDACTONE) 100 MG tablet TAKE 1 TABLET BY MOUTH TWICE A DAY FOR 90 DAYS   Yes [provider]  betamethasone dipropionate 0.05 % cream Apply topically 2 (two) times daily. For no more tan 10 days 04/03/21   Gweneth Dimitri, MD  clobetasol cream (TEMOVATE) 0.05 % 1(ONE) APPLICATION(S) TOPICAL 2(TWO) TIMES A DAY 08/26/21   [provider]  famotidine (PEPCID) 10 MG tablet Take 10 mg by mouth daily.    [provider]  gabapentin (NEURONTIN) 300 MG capsule Take 1 capsule by mouth daily.    [provider]    Family History Family History  Problem Relation Age of Onset   Breast cancer Mother 60       BRCA negative   Melanoma Father     Social History Social History   Tobacco Use   Smoking status: Former    Current packs/day: 0.00    Types: Cigarettes    Start date: 01/02/2010    Quit date: 01/03/2020    Years since quitting: 3.9   Smokeless tobacco: Never  Vaping Use   Vaping status: Never Used  Substance Use Topics   Alcohol use: Not Currently    Comment: a few times a month   Drug use: Never     Allergies   Ambrosia  artemisiifolia (ragweed) skin test, Dog epithelium (canis lupus familiaris), Selective estrogen receptor modulators, Vaccinium angustifolium, and Bupropion   Review of Systems Review of Systems  Constitutional:  Positive for fatigue. Negative for chills, diaphoresis and fever.  HENT:  Positive for congestion. Negative for ear pain, rhinorrhea, sinus pressure, sinus pain and sore throat.   Respiratory:  Positive for cough, chest tightness and shortness of breath.   Cardiovascular:  Negative for chest pain.  Gastrointestinal:  Negative for abdominal pain, nausea and vomiting.  Genitourinary:  Positive for frequency and urgency. Negative for difficulty urinating, dysuria and vaginal discharge.  Musculoskeletal:  Positive for myalgias.  Skin:  Negative for rash.  Neurological:  Positive for weakness. Negative for headaches.  Hematological:  Negative for adenopathy.     Physical Exam Triage Vital Signs  ED Triage Vitals  Encounter Vitals Group     BP 12/21/23 1354 112/79     Systolic BP Percentile --      Diastolic BP Percentile --      Pulse Rate 12/21/23 1354 81     Resp --      Temp 12/21/23 1354 98.6 F (37 C)     Temp Source 12/21/23 1354 Oral     SpO2 12/21/23 1354 95 %     Weight 12/21/23 1350 (!) 395 lb (179.2 kg)     Height 12/21/23 1350 5\' 7"  (1.702 m)     Head Circumference --      Peak Flow --      Pain Score 12/21/23 1347 5     Pain Loc --      Pain Education --      Exclude from Growth Chart --    No data found.  Updated Vital Signs BP 112/79 (BP Location: Left Arm)   Pulse 81   Temp 98.6 F (37 C) (Oral)   Ht 5\' 7"  (1.702 m)   Wt (!) 395 lb (179.2 kg)   LMP 11/26/2023   SpO2 95%   BMI 61.87 kg/m     Physical Exam Vitals and nursing note reviewed.  Constitutional:      General: She is not in acute distress.    Appearance: Normal appearance. She is obese. She is not ill-appearing or toxic-appearing.  HENT:     Head: Normocephalic and atraumatic.      Nose: Congestion present.     Mouth/Throat:     Mouth: Mucous membranes are moist.     Pharynx: Oropharynx is clear.     Tonsils: 2+ on the right. 2+ on the left.  Eyes:     General: No scleral icterus.       Right eye: No discharge.        Left eye: No discharge.     Conjunctiva/sclera: Conjunctivae normal.  Cardiovascular:     Rate and Rhythm: Normal rate and regular rhythm.     Heart sounds: Normal heart sounds.  Pulmonary:     Effort: Pulmonary effort is normal. No respiratory distress.     Breath sounds: Normal breath sounds.  Abdominal:     Palpations: Abdomen is soft.     Tenderness: There is no abdominal tenderness. There is no right CVA tenderness.  Musculoskeletal:     Cervical back: Neck supple.  Skin:    General: Skin is dry.  Neurological:     General: No focal deficit present.     Mental Status: She is alert. Mental status is at baseline.     Motor: No weakness.     Gait: Gait normal.  Psychiatric:        Mood and Affect: Mood normal.        Behavior: Behavior normal.      UC Treatments / Results  Labs (all labs ordered are listed, but only abnormal results are displayed) Labs Reviewed  URINALYSIS, W/ REFLEX TO CULTURE (INFECTION SUSPECTED) - Abnormal; Notable for the following components:      Result Value   Bacteria, UA FEW (*)    All other components within normal limits  GROUP A STREP BY PCR  CBC WITH DIFFERENTIAL/PLATELET  COMPREHENSIVE METABOLIC PANEL    EKG   Radiology DG Chest 2 View Result Date: 12/21/2023 CLINICAL DATA:  Short of breath, cough and chest tightness for a week. EXAM: CHEST - 2 VIEW COMPARISON:  08/26/2020. FINDINGS: The heart size and mediastinal contours are within normal limits. Both lungs are clear. No pleural effusion or pneumothorax. The visualized skeletal structures are unremarkable. IMPRESSION: No active cardiopulmonary disease. Electronically Signed   By: Amie Portland M.D.   On: 12/21/2023 14:50    Procedures ED  EKG  Date/Time: 12/21/2023 3:34 PM  Performed by: Shirlee Latch, PA-C Authorized by: Shirlee Latch, PA-C   Interpretation:    Interpretation: normal   Rate:    ECG rate:  85   ECG rate assessment: normal   Rhythm:    Rhythm: sinus rhythm   Ectopy:    Ectopy: none   QRS:    QRS axis:  Normal   QRS intervals:  Normal   QRS conduction: normal   ST segments:    ST segments:  Normal T waves:    T waves: normal   Comments:     Normal sinus rhythm. Regular rate.  (including critical care time)  Medications Ordered in UC Medications  albuterol (PROVENTIL) (2.5 MG/3ML) 0.083% nebulizer solution 2.5 mg (2.5 mg Nebulization Given 12/21/23 1505)    Initial Impression / Assessment and Plan / UC Course  I have reviewed the triage vital signs and the nursing notes.  Pertinent labs & imaging results that were available during my care of the patient were reviewed by me and considered in my medical decision making (see chart for details).   33 year old female with history of obesity, asthma, anxiety/depression, dysautonomia presents for 7 to 10-day history of fatigue, slight cough, mild congestion, chest/throat tightness, shortness of breath, dark urine and urinary urgency.  [facility] and tested negative for flu and COVID.  States she has broken the fever she had at onset but continues to feel fatigued.  Vitals are all normal and stable and she is overall well-appearing.  No acute distress.  Slight congestion on exam.  Mild erythema posterior pharynx with 2+ bilateral enlarged tonsils.  Chest clear.  Heart regular rate and rhythm.  No abdominal tenderness or CVA tenderness.  Strep negative.  Obtaining urinalysis, CBC, CMP, chest x-ray and EKG.  EKG within normal limits.  Chest x-ray normal.  CBC and CMP without any significant findings.  Patient was given albuterol nebulizer which she reported improved her symptoms.  Suspect patient likely has  had a viral illness but has broken the fever.  Flareup of asthma.  I have encouraged her to increase rest and fluids.  Refilled her albuterol inhaler and also sent prednisone taper for asthma flareup.  Thoroughly reviewed return and ER precautions.  Acute illness with systemic symptoms.  Flareup of chronic underlying condition.   Final Clinical Impressions(s) / UC Diagnoses   Final diagnoses:  Shortness of breath  Asthma with acute exacerbation, unspecified asthma severity, unspecified whether persistent  Other fatigue  Dark urine  Chest tightness     Discharge Instructions      -Chest x-ray normal.  EKG normal. - Labs all normal.  No urinary infection. - Symptoms consistent with a viral illness and flareup of asthma. - We gave you a breathing treatment the clinic and I refilled your inhaler.  Also sent a prednisone taper. - Follow-up with your pulmonologist as scheduled next month. - Go to the ER for any acute worsening of any symptoms return or fever.     ED Prescriptions     Medication Sig Dispense Auth. Provider   predniSONE (DELTASONE) 10 MG tablet Take 6  tabs p.o. on day 1 and decrease by 1 tablet daily until complete 21 tablet Eusebio Friendly B, PA-C   albuterol (VENTOLIN HFA) 108 (90 Base) MCG/ACT inhaler Inhale 1-2 puffs into the lungs every 6 (six) hours as needed for wheezing or shortness of breath. 1 g Shirlee Latch, PA-C      PDMP not reviewed this encounter.   Shirlee Latch, PA-C 12/21/23 1536

## 2023-12-21 NOTE — Discharge Instructions (Signed)
-  Chest x-ray normal.  EKG normal. - Labs all normal.  No urinary infection. - Symptoms consistent with a viral illness and flareup of asthma. - We gave you a breathing treatment the clinic and I refilled your inhaler.  Also sent a prednisone taper. - Follow-up with your pulmonologist as scheduled next month. - Go to the ER for any acute worsening of any symptoms return or fever.
